# Patient Record
Sex: Male | Born: 1955 | Race: White | Hispanic: No | Marital: Single | Smoking: Never smoker
Health system: Southern US, Community
[De-identification: ages and names within clinical notes are randomized; demographics above are authoritative.]

## PROBLEM LIST (undated history)

## (undated) DIAGNOSIS — I10 Essential (primary) hypertension: Secondary | ICD-10-CM

## (undated) DIAGNOSIS — E785 Hyperlipidemia, unspecified: Secondary | ICD-10-CM

## (undated) DIAGNOSIS — I5189 Other ill-defined heart diseases: Secondary | ICD-10-CM

## (undated) DIAGNOSIS — N529 Male erectile dysfunction, unspecified: Secondary | ICD-10-CM

## (undated) DIAGNOSIS — I2510 Atherosclerotic heart disease of native coronary artery without angina pectoris: Secondary | ICD-10-CM

## (undated) DIAGNOSIS — E039 Hypothyroidism, unspecified: Secondary | ICD-10-CM

## (undated) DIAGNOSIS — I219 Acute myocardial infarction, unspecified: Secondary | ICD-10-CM

## (undated) DIAGNOSIS — K219 Gastro-esophageal reflux disease without esophagitis: Secondary | ICD-10-CM

## (undated) DIAGNOSIS — Z8619 Personal history of other infectious and parasitic diseases: Secondary | ICD-10-CM

## (undated) HISTORY — DX: Gastro-esophageal reflux disease without esophagitis: K21.9

## (undated) HISTORY — DX: Personal history of other infectious and parasitic diseases: Z86.19

## (undated) HISTORY — DX: Hypothyroidism, unspecified: E03.9

## (undated) HISTORY — DX: Acute myocardial infarction, unspecified: I21.9

## (undated) HISTORY — DX: Hyperlipidemia, unspecified: E78.5

## (undated) HISTORY — PX: KNEE SURGERY: SHX244

## (undated) HISTORY — DX: Male erectile dysfunction, unspecified: N52.9

## (undated) HISTORY — DX: Essential (primary) hypertension: I10

---

## 2005-08-14 ENCOUNTER — Emergency Department: Payer: Self-pay | Admitting: General Practice

## 2005-08-14 ENCOUNTER — Other Ambulatory Visit: Payer: Self-pay

## 2005-08-19 ENCOUNTER — Ambulatory Visit: Payer: Self-pay | Admitting: General Practice

## 2008-05-25 IMAGING — CR DG CHEST 2V
1 series · 2 of 2 positions shown · non-contrast
Comparison: none

REASON FOR EXAM: Chest pain  [HOSPITAL]
COMMENTS:

PROCEDURE:     DXR - DXR CHEST PA (OR AP) AND LATERAL  - August 14, 2005 [DATE]
RESULT:        The lung fields are clear.  No pneumonia, pneumothorax or
pleural effusion is seen.  The heart, mediastinal and osseous structures
show no significant abnormalities.

[Series 1: view not recorded · 0.17mm/px · 2 of 2 slices shown]
[im 1/2]
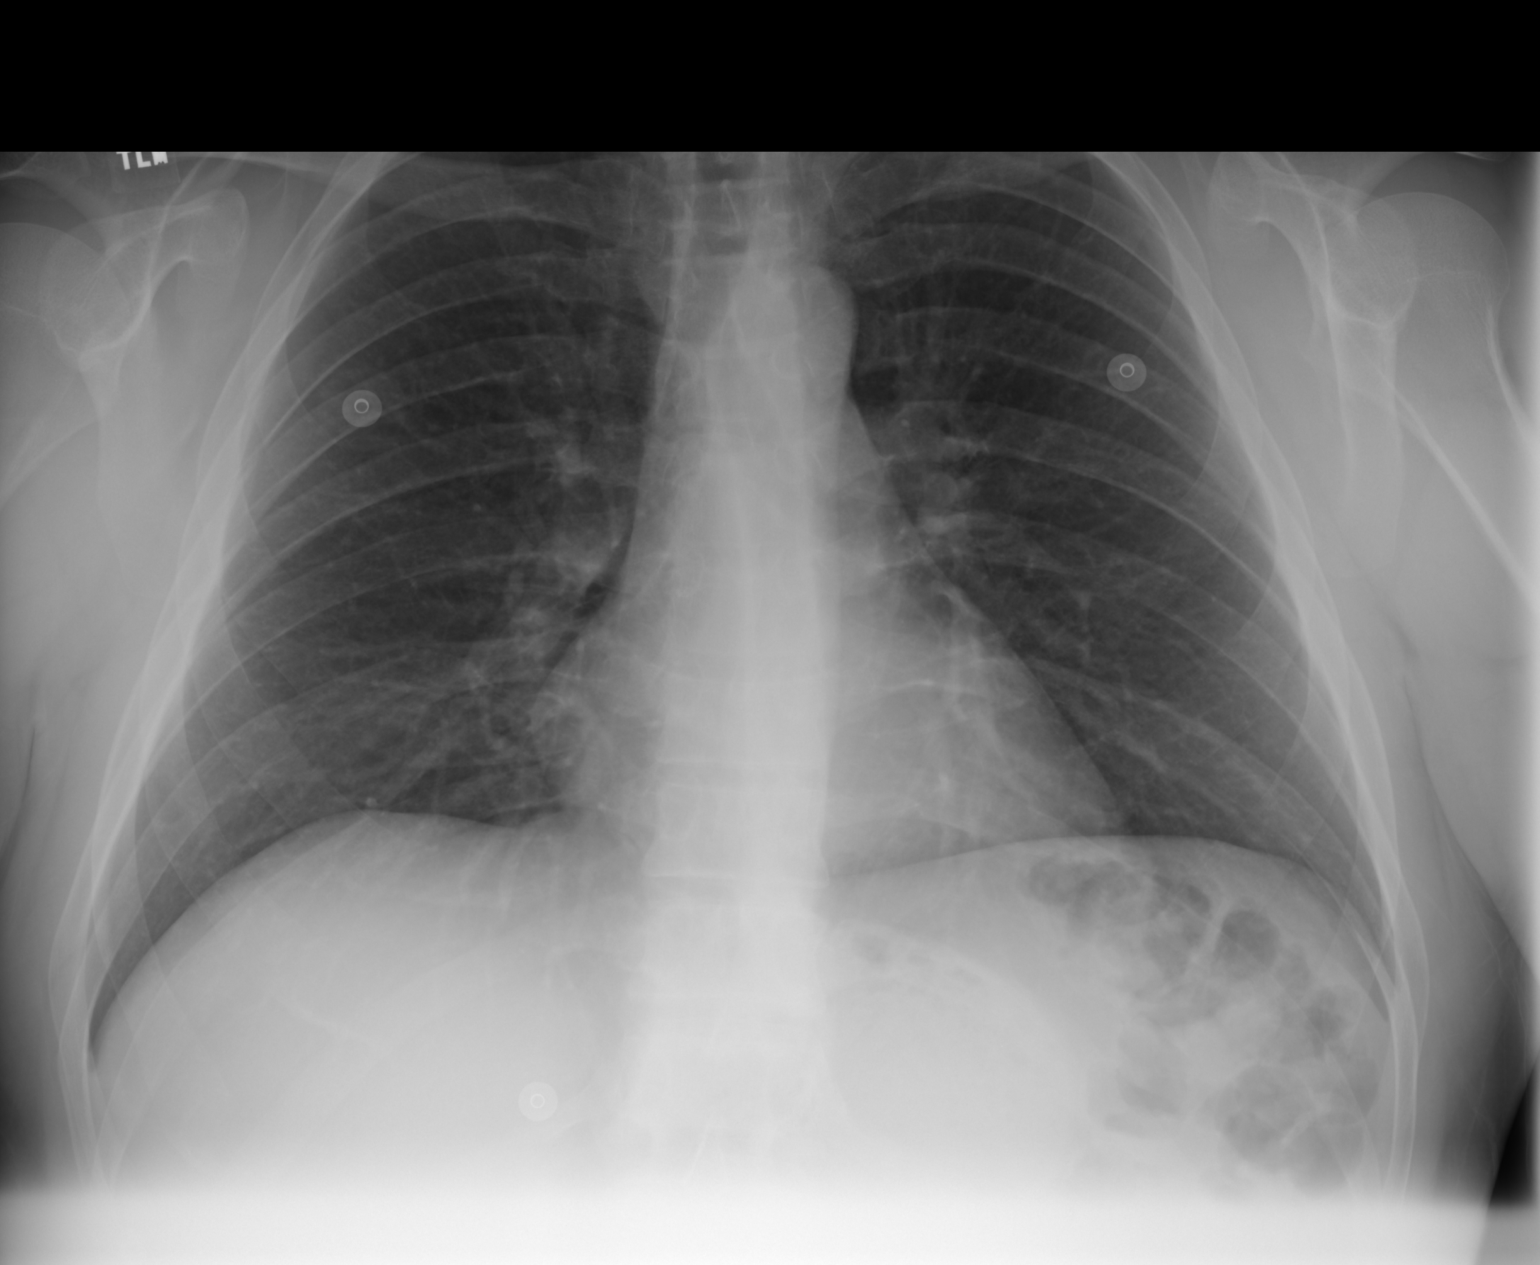
[im 2/2]
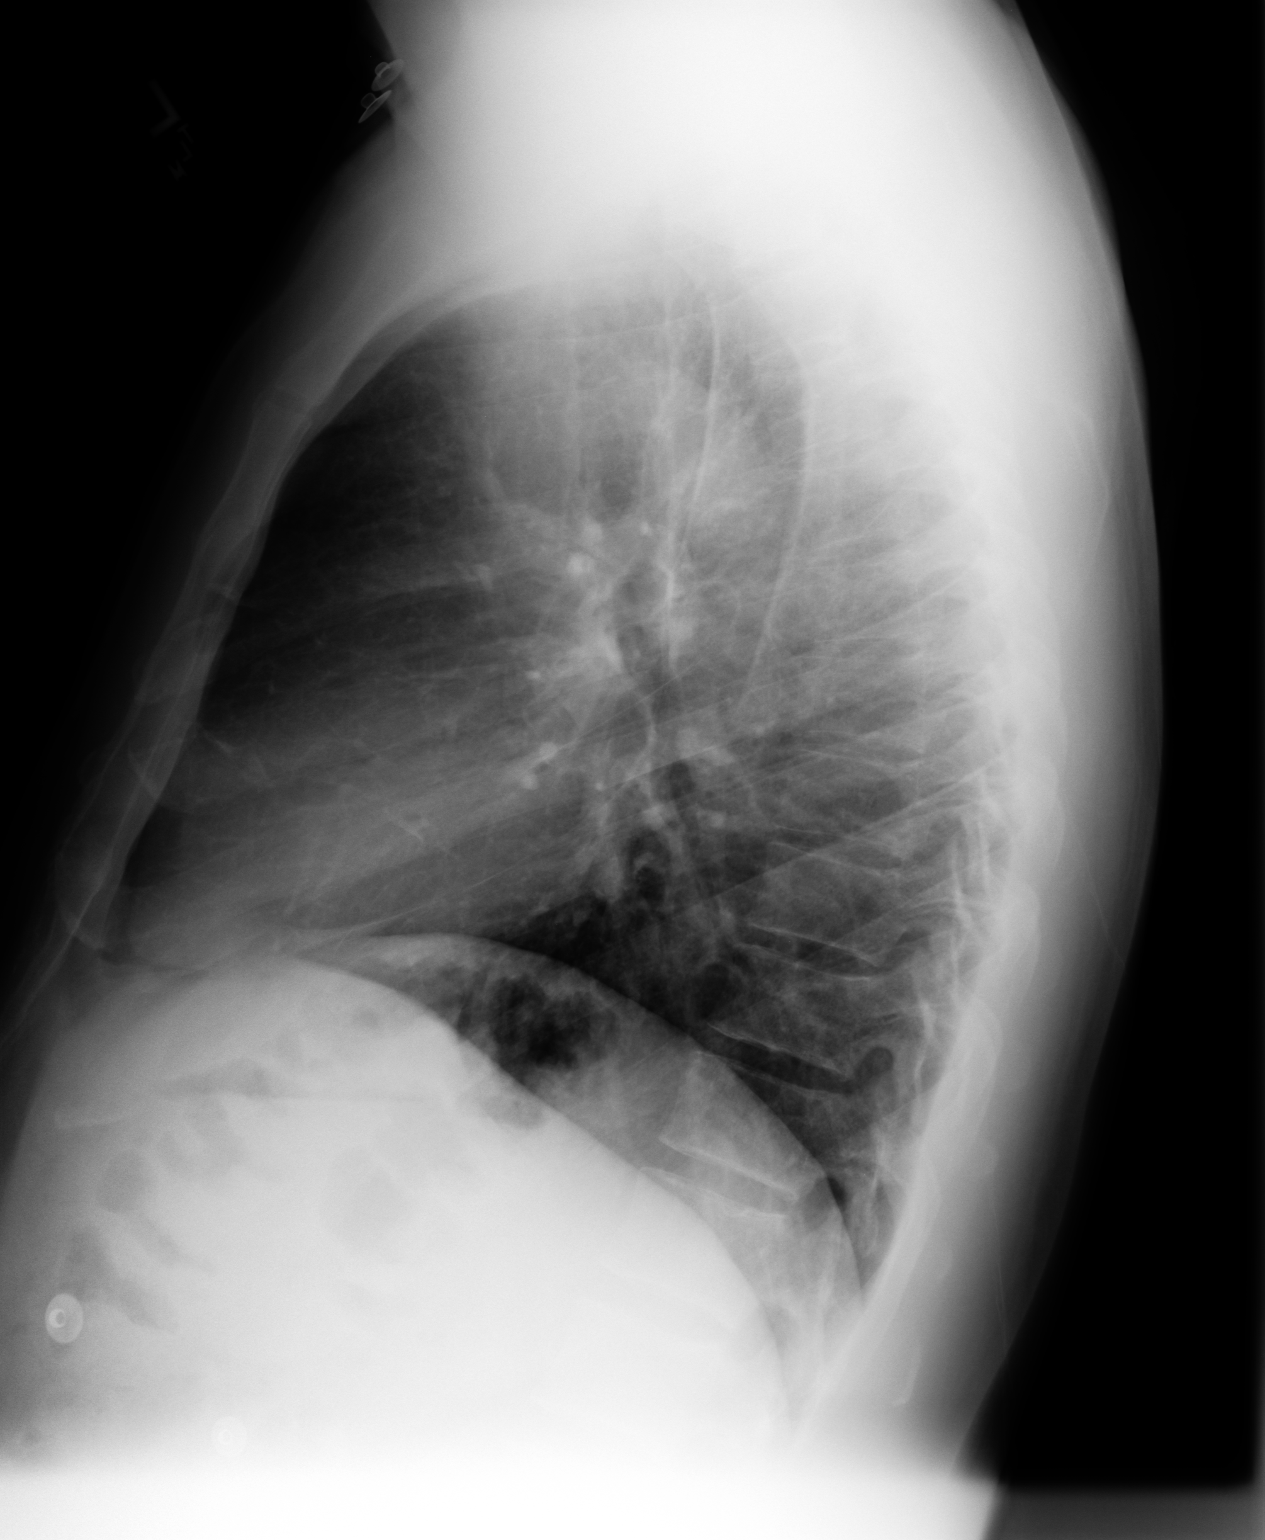

[2 of 2 positions shown; findings below may reference images not displayed]

IMPRESSION: No significant abnormalities are noted.

## 2008-09-29 ENCOUNTER — Emergency Department: Payer: Self-pay | Admitting: Unknown Physician Specialty

## 2010-05-14 ENCOUNTER — Emergency Department: Payer: Self-pay | Admitting: Neurology

## 2011-03-05 HISTORY — PX: DOBUTAMINE STRESS ECHO: SHX5426

## 2012-02-28 ENCOUNTER — Emergency Department: Payer: Self-pay | Admitting: Emergency Medicine

## 2012-02-28 LAB — CBC
HCT: 43.2 % (ref 40.0–52.0)
HGB: 14.3 g/dL (ref 13.0–18.0)
MCH: 29.8 pg (ref 26.0–34.0)
MCHC: 33.1 g/dL (ref 32.0–36.0)
MCV: 90 fL (ref 80–100)
RBC: 4.79 10*6/uL (ref 4.40–5.90)

## 2012-02-28 LAB — COMPREHENSIVE METABOLIC PANEL
Albumin: 3.6 g/dL (ref 3.4–5.0)
Alkaline Phosphatase: 64 U/L (ref 50–136)
Bilirubin,Total: 0.1 mg/dL — ABNORMAL LOW (ref 0.2–1.0)
Calcium, Total: 8.5 mg/dL (ref 8.5–10.1)
Co2: 25 mmol/L (ref 21–32)
Creatinine: 1 mg/dL (ref 0.60–1.30)
EGFR (African American): >60
EGFR (Non-African Amer.): >60
Glucose: 117 mg/dL — ABNORMAL HIGH (ref 65–99)
Osmolality: 295 (ref 275–301)
Potassium: 3.4 mmol/L — ABNORMAL LOW (ref 3.5–5.1)
SGOT(AST): 18 U/L (ref 15–37)
SGPT (ALT): 26 U/L (ref 12–78)
Sodium: 147 mmol/L — ABNORMAL HIGH (ref 136–145)

## 2012-02-28 LAB — CK TOTAL AND CKMB (NOT AT ARMC)
CK, Total: 97 U/L (ref 35–232)
CK-MB: 0.7 ng/mL (ref 0.5–3.6)

## 2012-02-28 LAB — TROPONIN I: Troponin-I: <0.02 ng/mL

## 2012-03-18 ENCOUNTER — Ambulatory Visit (INDEPENDENT_AMBULATORY_CARE_PROVIDER_SITE_OTHER): Payer: 59 | Admitting: Cardiovascular Disease

## 2012-03-18 ENCOUNTER — Encounter: Payer: Self-pay | Admitting: Cardiovascular Disease

## 2012-03-18 VITALS — BP 112/80 | HR 53 | Ht 75.0 in | Wt 225.2 lb

## 2012-03-18 DIAGNOSIS — R1013 Epigastric pain: Secondary | ICD-10-CM

## 2012-03-18 DIAGNOSIS — R6884 Jaw pain: Secondary | ICD-10-CM

## 2012-03-18 DIAGNOSIS — R0789 Other chest pain: Secondary | ICD-10-CM

## 2012-03-18 DIAGNOSIS — K30 Functional dyspepsia: Secondary | ICD-10-CM

## 2012-03-18 DIAGNOSIS — E785 Hyperlipidemia, unspecified: Secondary | ICD-10-CM | POA: Insufficient documentation

## 2012-03-18 NOTE — Progress Notes (Signed)
   Patient ID: Jackson Williamson, male    DOB: [birth year], 57 y.o.   MRN: [id number]  HPI Comments: Jackson Williamson is a 57 year old gentleman with history of hyperlipidemia, currently with no primary care physician is scheduled to see [facility] in one month with reported echocardiogram and stress echo several years ago at [place], reports that he does not like taking medications, strong family history of coronary artery disease with brother having recent diagnosis of severe coronary artery disease, who presents for evaluation after recent episodes of jaw pain, sweating.  Reports that he went to the emergency room on 02/28/2012 after having jaw pain. When he had sweating, he got very concerned. Brother had recent diagnosis of coronary artery disease. He reports that he has similar genetics to his brother. He is not a smoker, no diabetes. He exercises on a regular basis 5 days per week with good exercise tolerance. No further episodes of jaw pain in the past several weeks.  He reports that he is always had an abnormal EKG with "inverted T wave" Lab work in the hospital was essentially normal  EKG in the  hospital December 27 showed normal sinus rhythm with rate 61 beats per minute, no significant ST or T wave changes EKG today shows sinus bradycardia with rate 53 beats per minute with no significant ST or T wave changes   Outpatient Encounter Prescriptions as of 03/18/2012  Medication Sig Dispense Refill  . aspirin 81 MG tablet Take 81 mg by mouth 2 (two) times daily.        Review of Systems  Constitutional: Negative.        Episode of sweating  HENT: Negative.        Jaw pain  Eyes: Negative.   Respiratory: Negative.   Cardiovascular: Negative.   Gastrointestinal: Negative.   Musculoskeletal: Negative.   Skin: Negative.   Neurological: Negative.   Hematological: Negative.   Psychiatric/Behavioral: Negative.   All other systems reviewed and are negative.    BP 112/80  Pulse 53  Ht  6\' 3"  (1.905 m)  Wt 225 lb 4 oz (102.173 kg)  BMI 28.15 kg/m2  Physical Exam  Nursing note and vitals reviewed. Constitutional: He is oriented to person, place, and time. He appears well-developed and well-nourished.  HENT:  Head: Normocephalic.  Nose: Nose normal.  Mouth/Throat: Oropharynx is clear and moist.  Eyes: Conjunctivae normal are normal. Pupils are equal, round, and reactive to light.  Neck: Normal range of motion. Neck supple. No JVD present.  Cardiovascular: Normal rate, regular rhythm, S1 normal, S2 normal, normal heart sounds and intact distal pulses.  Exam reveals no gallop and no friction rub.   No murmur heard. Pulmonary/Chest: Effort normal and breath sounds normal. No respiratory distress. He has no wheezes. He has no rales. He exhibits no tenderness.  Abdominal: Soft. Bowel sounds are normal. He exhibits no distension. There is no tenderness.  Musculoskeletal: Normal range of motion. He exhibits no edema and no tenderness.  Lymphadenopathy:    He has no cervical adenopathy.  Neurological: He is alert and oriented to person, place, and time. Coordination normal.  Skin: Skin is warm and dry. No rash noted. No erythema.  Psychiatric: He has a normal mood and affect. His behavior is normal. Judgment and thought content normal.           Assessment and Plan

## 2012-03-18 NOTE — Patient Instructions (Addendum)
You are doing well. No medication changes were made.  We will check you labs next week  Please call us if you have new issues that need to be addressed before your next appt.  Your physician wants you to follow-up in: 6 months.  You will receive a reminder letter in the mail two months in advance. If you don't receive a letter, please call our office to schedule the follow-up appointment.

## 2012-03-18 NOTE — Assessment & Plan Note (Signed)
We'll check his cholesterol next week. If high, we will encourage him to restart a statin. He was previously on lovastatin and stopped this several years ago on his own.

## 2012-03-18 NOTE — Assessment & Plan Note (Signed)
Atypical type symptoms. No significant chest pain with exertion. We have suggested that he coughed this for any symptoms of chest pain or anginal type symptoms with exertion. We did discuss doing a stress test. He would like to wait at this time. Jaw pain could have been secondary to TMJ. Has not recurred.

## 2012-03-26 ENCOUNTER — Telehealth: Payer: Self-pay

## 2012-03-26 NOTE — Telephone Encounter (Signed)
lmtcb re:due for labs

## 2012-03-31 ENCOUNTER — Other Ambulatory Visit: Payer: 59

## 2012-03-31 NOTE — Telephone Encounter (Signed)
Pt coming in today for labs

## 2012-04-03 ENCOUNTER — Ambulatory Visit (INDEPENDENT_AMBULATORY_CARE_PROVIDER_SITE_OTHER): Payer: 59

## 2012-04-03 DIAGNOSIS — R0789 Other chest pain: Secondary | ICD-10-CM

## 2012-04-03 DIAGNOSIS — E785 Hyperlipidemia, unspecified: Secondary | ICD-10-CM

## 2012-04-04 LAB — LIPID PANEL
Chol/HDL Ratio: 7.4 ratio units — ABNORMAL HIGH (ref 0.0–5.0)
HDL: 40 mg/dL (ref >39)

## 2012-04-06 ENCOUNTER — Other Ambulatory Visit: Payer: Self-pay

## 2012-04-06 DIAGNOSIS — E785 Hyperlipidemia, unspecified: Secondary | ICD-10-CM

## 2012-04-06 MED ORDER — ATORVASTATIN CALCIUM 40 MG PO TABS: 40.0000 mg | ORAL_TABLET | Freq: Every day | ORAL | Status: DC

## 2012-04-29 ENCOUNTER — Ambulatory Visit (INDEPENDENT_AMBULATORY_CARE_PROVIDER_SITE_OTHER): Payer: 59 | Admitting: Family Medicine

## 2012-04-29 ENCOUNTER — Encounter: Payer: Self-pay | Admitting: Family Medicine

## 2012-04-29 VITALS — BP 128/82 | HR 64 | Temp 97.7°F | Ht 75.0 in | Wt 223.8 lb

## 2012-04-29 DIAGNOSIS — E039 Hypothyroidism, unspecified: Secondary | ICD-10-CM | POA: Insufficient documentation

## 2012-04-29 DIAGNOSIS — K4090 Unilateral inguinal hernia, without obstruction or gangrene, not specified as recurrent: Secondary | ICD-10-CM

## 2012-04-29 DIAGNOSIS — K219 Gastro-esophageal reflux disease without esophagitis: Secondary | ICD-10-CM | POA: Insufficient documentation

## 2012-04-29 DIAGNOSIS — E785 Hyperlipidemia, unspecified: Secondary | ICD-10-CM

## 2012-04-29 DIAGNOSIS — Z125 Encounter for screening for malignant neoplasm of prostate: Secondary | ICD-10-CM

## 2012-04-29 DIAGNOSIS — Z113 Encounter for screening for infections with a predominantly sexual mode of transmission: Secondary | ICD-10-CM

## 2012-04-29 LAB — TSH: TSH: 5.38 u[IU]/mL (ref 0.35–5.50)

## 2012-04-29 LAB — T4, FREE: Free T4: 0.84 ng/dL (ref 0.60–1.60)

## 2012-04-29 NOTE — Assessment & Plan Note (Signed)
Screen per patient request.

## 2012-04-29 NOTE — Progress Notes (Signed)
Subjective:    Patient ID: Jackson Williamson, male    DOB: 1955/07/26, 57 y.o.   MRN: [id number]  HPI CC: new pt to establish  Prior PCP Dr. Patrecia Pace.  Got upset .  Last seen 3-4 yrs ago  HLD - LDL 230.  + fmhx CAD.  Tolerating lipitor well.  No myalgias.  Occasional foot cramps. Hypothyroid - has not had thyroid medication in several years.  Due for recheck. GERD - tries to follow paleo diet.  Occasional OTC meds, don't help (including prilosec).  Weight lifting last week - left inguinal hernia much larger after weight.  Also with umbilical hernia. Neither bothering him.  Requests STD screen.  >2 hours since last void.  No rashes, dysuria, discharge.  1 partner in last year.  New partner - widow, wants to be very careful with her.  Preventative: No recent CPE  Lives with daughter, 1 dog (Advertising account planner) Occupation: Museum/gallery curator Edu: BS Activity: runs, bikes, swims, cross fit 5-6 d/wk Diet: good water, fruits/vegetables daily, "80% paleo"  Medications and allergies reviewed and updated in chart.  Past histories reviewed and updated if relevant as below. Patient Active Problem List  Diagnosis  . Jaw pain  . Hyperlipidemia   Past Medical History  Diagnosis Date  . Hyperlipidemia   . Hypothyroidism   . GERD (gastroesophageal reflux disease)    Past Surgical History  Procedure Laterality Date  . Knee surgery  [phone]    left knee   History  Substance Use Topics  . Smoking status: Never Smoker   . Smokeless tobacco: Former Neurosurgeon    Types: Snuff    Quit date: 03/18/2008     Comment: social as youth  . Alcohol Use: No   Family History  Problem Relation Age of Onset  . CAD Mother 3    s/p stent  . Hyperlipidemia Mother   . CAD Father 93    s/p CABG x 4  . Hyperlipidemia Father   . Hyperlipidemia Brother   . Hypertension Father   . CAD Brother 29    stent   No Known Allergies Current Outpatient Prescriptions on File Prior to Visit  Medication Sig  Dispense Refill  . aspirin 81 MG tablet Take 81 mg by mouth daily.        No current facility-administered medications on file prior to visit.     Review of Systems  Constitutional: Negative for fever, chills, activity change, appetite change, fatigue and unexpected weight change.  HENT: Negative for hearing loss and neck pain.   Eyes: Negative for visual disturbance.  Respiratory: Negative for cough, chest tightness, shortness of breath and wheezing.   Cardiovascular: Negative for chest pain, palpitations and leg swelling.  Gastrointestinal: Negative for nausea, vomiting, abdominal pain, diarrhea, constipation, blood in stool and abdominal distention.  Genitourinary: Negative for hematuria and difficulty urinating.  Musculoskeletal: Negative for myalgias and arthralgias.  Skin: Negative for rash.  Neurological: Negative for dizziness, seizures, syncope and headaches.  Hematological: Does not bruise/bleed easily.  Psychiatric/Behavioral: Negative for dysphoric mood. The patient is not nervous/anxious.        Objective:   Physical Exam  Nursing note and vitals reviewed. Constitutional: He is oriented to person, place, and time. He appears well-developed and well-nourished. No distress.  HENT:  Head: Normocephalic and atraumatic.  Right Ear: Hearing, tympanic membrane, external ear and ear canal normal.  Left Ear: Hearing, tympanic membrane, external ear and ear canal normal.  Nose: Nose normal.  Mouth/Throat: Oropharynx  is clear and moist. No oropharyngeal exudate.  Eyes: Conjunctivae and EOM are normal. Pupils are equal, round, and reactive to light. No scleral icterus.  Neck: Normal range of motion. Neck supple. No thyromegaly present.  Cardiovascular: Normal rate, regular rhythm, normal heart sounds and intact distal pulses.   No murmur heard. Pulses:      Radial pulses are 2+ on the right side, and 2+ on the left side.  Pulmonary/Chest: Effort normal and breath sounds normal.  No respiratory distress. He has no wheezes. He has no rales.  Abdominal: Soft. Normal appearance and bowel sounds are normal. He exhibits no distension and no mass. There is no tenderness. There is no rebound and no guarding. A hernia is present. Hernia confirmed positive in the right inguinal area.  Umbilical hernia, nontender, about 2cm size Large R inguinal hernia protruding into scrotum, reducible when laying down, not tender  Musculoskeletal: Normal range of motion. He exhibits no edema.  Lymphadenopathy:    He has no cervical adenopathy.  Neurological: He is alert and oriented to person, place, and time.  CN grossly intact, station and gait intact  Skin: Skin is warm and dry. No rash noted.  Psychiatric: He has a normal mood and affect. His behavior is normal. Judgment and thought content normal.       Assessment & Plan:

## 2012-04-29 NOTE — Assessment & Plan Note (Signed)
Large R hernia, not strangulated. Refer to surgery. Avoid heavy lifting until evaluation. Discussed red flags to seek emergent care.

## 2012-04-29 NOTE — Patient Instructions (Addendum)
Blood work today to check thyroid and STD screen. Return in 3 months for physical, prior fasting for blood work to recheck cholesterol levels. Good to meet you today, call us with questions.

## 2012-04-29 NOTE — Assessment & Plan Note (Addendum)
Check TFTs today. Start synthroid accordingly.  States prior on dose.

## 2012-04-29 NOTE — Assessment & Plan Note (Signed)
Diet controlled.  

## 2012-04-29 NOTE — Assessment & Plan Note (Signed)
Very elevated, fmhx CAD and HLD. Started on lipitor 40mg  daily per cards. I have asked him to return in 3 mo to recheck cholesterol levels.

## 2012-04-30 LAB — GC/CHLAMYDIA PROBE AMP, URINE: Chlamydia, Swab/Urine, PCR: NEGATIVE

## 2012-04-30 LAB — RPR

## 2012-05-02 HISTORY — PX: HERNIA REPAIR: SHX51

## 2012-05-20 ENCOUNTER — Other Ambulatory Visit: Payer: Self-pay | Admitting: General Surgery

## 2012-05-20 DIAGNOSIS — K4090 Unilateral inguinal hernia, without obstruction or gangrene, not specified as recurrent: Secondary | ICD-10-CM

## 2012-05-20 DIAGNOSIS — K429 Umbilical hernia without obstruction or gangrene: Secondary | ICD-10-CM

## 2012-05-21 ENCOUNTER — Ambulatory Visit: Payer: Self-pay | Admitting: General Surgery

## 2012-05-29 ENCOUNTER — Ambulatory Visit: Payer: Self-pay | Admitting: General Surgery

## 2012-05-29 DIAGNOSIS — K4090 Unilateral inguinal hernia, without obstruction or gangrene, not specified as recurrent: Secondary | ICD-10-CM

## 2012-05-29 DIAGNOSIS — K429 Umbilical hernia without obstruction or gangrene: Secondary | ICD-10-CM

## 2012-06-02 ENCOUNTER — Encounter: Payer: Self-pay | Admitting: General Surgery

## 2012-06-11 ENCOUNTER — Ambulatory Visit (INDEPENDENT_AMBULATORY_CARE_PROVIDER_SITE_OTHER): Payer: 59 | Admitting: General Surgery

## 2012-06-11 ENCOUNTER — Encounter: Payer: Self-pay | Admitting: General Surgery

## 2012-06-11 VITALS — BP 132/82 | HR 64 | Resp 14 | Ht 75.0 in | Wt 221.0 lb

## 2012-06-11 DIAGNOSIS — K4090 Unilateral inguinal hernia, without obstruction or gangrene, not specified as recurrent: Secondary | ICD-10-CM

## 2012-06-11 DIAGNOSIS — K429 Umbilical hernia without obstruction or gangrene: Secondary | ICD-10-CM

## 2012-06-11 NOTE — Progress Notes (Signed)
Patient ID: Jackson Williamson, male   DOB: 1955-04-03, 57 y.o.   MRN: [id number]  Chief Complaint  Patient presents with  . Routine Post Op    umbilical hernia    HPI Jackson Williamson is a 57 y.o. male.  Patient here today for follow up from laparoscopic right inguinal hernia and umbilical hernia repair with physio mesh on 05-29-12.  No pain medications, and getting along well. HPI  Past Medical History  Diagnosis Date  . Hyperlipidemia   . Hypothyroidism   . GERD (gastroesophageal reflux disease)   . History of chicken pox     Past Surgical History  Procedure Laterality Date  . Knee surgery  [phone]    left knee  . Hernia repair  March 2014    RIH and umbilical    Family History  Problem Relation Age of Onset  . CAD Mother 30    s/p stent  . Hyperlipidemia Mother   . CAD Father 63    s/p CABG x 4  . Hyperlipidemia Father   . Hyperlipidemia Brother   . Hypertension Father   . CAD Brother 61    stent    Social History History  Substance Use Topics  . Smoking status: Never Smoker   . Smokeless tobacco: Former Neurosurgeon    Types: Snuff    Quit date: 03/18/2008     Comment: social as youth  . Alcohol Use: No    No Known Allergies  Current Outpatient Prescriptions  Medication Sig Dispense Refill  . aspirin 81 MG tablet Take 81 mg by mouth daily.       Marland Kitchen atorvastatin (LIPITOR) 40 MG tablet Take 40 mg by mouth at bedtime.       No current facility-administered medications for this visit.    Review of Systems Review of Systems  Constitutional: Negative.   Respiratory: Negative.   Cardiovascular: Negative.     Blood pressure 132/82, pulse 64, resp. rate 14, height 6\' 3"  (1.905 m), weight 221 lb (100.245 kg).  Physical Exam Physical Exam  Constitutional: He is oriented to person, place, and time. He appears well-developed and well-nourished.  Abdominal: Soft.  Neurological: He is alert and oriented to person, place, and time.  Skin: Skin is warm and dry.  Port  sites clean and healing well Mild swelling at umbilical and right groin area but no hernia noted  Data Reviewed    Assessment    Doing well post op     Plan    Increase activity as tolerated slowly back to normal.        Anarosa Kubisiak G 06/11/2012, 11:15 AM

## 2012-06-11 NOTE — Patient Instructions (Addendum)
Resume activities gradually as tolerated, no running as of yet. Bicycle is ok.

## 2012-07-06 ENCOUNTER — Telehealth: Payer: Self-pay

## 2012-07-06 NOTE — Telephone Encounter (Signed)
lmtcb re:due for l/l

## 2012-07-06 NOTE — Telephone Encounter (Signed)
LL

## 2012-07-09 ENCOUNTER — Ambulatory Visit: Payer: 59 | Admitting: General Surgery

## 2012-07-09 NOTE — Telephone Encounter (Signed)
I spoke with the patient. He is having a cmp/tsh/psa/lipomed profile done on 5/22 with Dr. Sharen Hones at [facility]. The patient states he asked Dr. Sharen Hones to order the lipomed profile, but Dr. Sharen Hones stated he may not be able to interpret this. I advised I will leave a message for Efraim Kaufmann so she can f/u on his labs after 5/22. He also states that he just had 2 hernias repaired and his surgeon stated he heard a murmur. Per Dr. Windell Hummingbird 1/14 office note, no murmur heard. The patient is due for his physical with his PCP on 5/29.

## 2012-07-10 ENCOUNTER — Encounter: Payer: Self-pay | Admitting: *Deleted

## 2012-07-10 NOTE — Telephone Encounter (Signed)
Fyi; please see below [id number]. Do you need to see him sooner or have him f/u with PCP as planned?

## 2012-07-10 NOTE — Telephone Encounter (Signed)
Would see if care physician can appreciate murmur If murmur heard, echocardiogram could be ordered

## 2012-07-13 NOTE — Telephone Encounter (Signed)
Please see below [id number] Let us know if he needs echo Thanks!!

## 2012-07-23 ENCOUNTER — Ambulatory Visit: Payer: 59 | Admitting: General Surgery

## 2012-07-23 ENCOUNTER — Other Ambulatory Visit (INDEPENDENT_AMBULATORY_CARE_PROVIDER_SITE_OTHER): Payer: 59

## 2012-07-23 DIAGNOSIS — E785 Hyperlipidemia, unspecified: Secondary | ICD-10-CM

## 2012-07-23 DIAGNOSIS — E039 Hypothyroidism, unspecified: Secondary | ICD-10-CM

## 2012-07-23 DIAGNOSIS — Z125 Encounter for screening for malignant neoplasm of prostate: Secondary | ICD-10-CM

## 2012-07-23 LAB — COMPREHENSIVE METABOLIC PANEL
Albumin: 3.8 g/dL (ref 3.5–5.2)
BUN: 19 mg/dL (ref 6–23)
Calcium: 9.2 mg/dL (ref 8.4–10.5)
Chloride: 107 mEq/L (ref 96–112)
Creatinine, Ser: 1.2 mg/dL (ref 0.4–1.5)
GFR: 65.66 mL/min (ref >60.00)
Glucose, Bld: 94 mg/dL (ref 70–99)
Potassium: 4.1 mEq/L (ref 3.5–5.1)

## 2012-07-23 LAB — TSH: TSH: 7.76 u[IU]/mL — ABNORMAL HIGH (ref 0.35–5.50)

## 2012-07-23 LAB — PSA: PSA: 1.15 ng/mL (ref 0.10–4.00)

## 2012-07-28 LAB — NMR LIPOPROFILE WITH LIPIDS
HDL Size: 8.6 nm — ABNORMAL LOW (ref >=9.2)
LDL Particle Number: 1214 nmol/L — ABNORMAL HIGH (ref <1000)
LDL Size: 20.4 nm — ABNORMAL LOW (ref >20.5)
Large HDL-P: 3.8 umol/L — ABNORMAL LOW (ref >=4.8)
Large VLDL-P: 2.5 nmol/L (ref <=2.7)
Small LDL Particle Number: 684 nmol/L — ABNORMAL HIGH (ref <=527)
VLDL Size: 53.9 nm — ABNORMAL HIGH (ref <=46.6)

## 2012-07-29 ENCOUNTER — Encounter: Payer: Self-pay | Admitting: Family Medicine

## 2012-07-30 ENCOUNTER — Encounter: Payer: Self-pay | Admitting: Family Medicine

## 2012-07-30 ENCOUNTER — Ambulatory Visit (INDEPENDENT_AMBULATORY_CARE_PROVIDER_SITE_OTHER): Payer: 59 | Admitting: Family Medicine

## 2012-07-30 VITALS — BP 128/84 | HR 68 | Temp 98.1°F | Ht 75.0 in | Wt 215.5 lb

## 2012-07-30 DIAGNOSIS — E039 Hypothyroidism, unspecified: Secondary | ICD-10-CM

## 2012-07-30 DIAGNOSIS — Z1211 Encounter for screening for malignant neoplasm of colon: Secondary | ICD-10-CM

## 2012-07-30 DIAGNOSIS — R011 Cardiac murmur, unspecified: Secondary | ICD-10-CM | POA: Insufficient documentation

## 2012-07-30 DIAGNOSIS — K4090 Unilateral inguinal hernia, without obstruction or gangrene, not specified as recurrent: Secondary | ICD-10-CM

## 2012-07-30 DIAGNOSIS — Z0000 Encounter for general adult medical examination without abnormal findings: Secondary | ICD-10-CM

## 2012-07-30 DIAGNOSIS — E785 Hyperlipidemia, unspecified: Secondary | ICD-10-CM

## 2012-07-30 LAB — T4, FREE: Free T4: 0.88 ng/dL (ref 0.60–1.60)

## 2012-07-30 LAB — HEMOCCULT GUIAC POC 1CARD (OFFICE): Fecal Occult Blood, POC: NEGATIVE

## 2012-07-30 NOTE — Assessment & Plan Note (Signed)
Reviewed #s with patient, overall good control, but LDL particle size >1000 - recommended check with Dr. Mariah Milling to see if change in regimen indicated in fmhx but no personal hx CAD.

## 2012-07-30 NOTE — Progress Notes (Signed)
Subjective:    Patient ID: Jackson Williamson, male    DOB: Jun 01, 1955, 57 y.o.   MRN: [id number]  HPI CC: CPE  Does have strong fmhx CAD.  On lipitor 40mg  daily.  Recently had inguinal hernia surgery - has missed 2 appointments with surgeon.  H/o hypothryoidism - strong fmhx.  Prior on levothyroxine .  Denies skin or hair changes, diarrhea,/constipation, heat/cold intolerance. Wt Readings from Last 3 Encounters:  07/30/12 215 lb 8 oz (97.75 kg)  06/11/12 221 lb (100.245 kg)  04/29/12 223 lb 12 oz (101.492 kg)    Preventative:  Colon cancer screening - aunt with colon cancer at age 28.  Discussed - requests stool kit. Prostate cancer screening - has had in past.  Gets checked yearly. Tetanus - unsure - think around 2006 Flu shot - does not receive - got sick after one flu shot  Lives with daughter, 1 dog (Advertising account planner) Occupation: Museum/gallery curator  Edu: BS  Activity: runs, bikes, swims, cross fit 5-6 d/wk  Diet: good water, fruits/vegetables daily, "80% paleo"  Medications and allergies reviewed and updated in chart.  Past histories reviewed and updated if relevant as below. Patient Active Problem List   Diagnosis Date Noted  . Screen for STD (sexually transmitted disease) 04/29/2012  . Right inguinal hernia 04/29/2012  . Hypothyroidism   . GERD (gastroesophageal reflux disease)   . Hyperlipidemia 03/18/2012   Past Medical History  Diagnosis Date  . Hyperlipidemia   . Hypothyroidism   . GERD (gastroesophageal reflux disease)   . History of chicken pox    Past Surgical History  Procedure Laterality Date  . Knee surgery  [phone]    left knee  . Hernia repair Right 05/2012    RIH and umbilical   History  Substance Use Topics  . Smoking status: Never Smoker   . Smokeless tobacco: Former Neurosurgeon    Types: Snuff    Quit date: 03/18/2008     Comment: social as youth  . Alcohol Use: No   Family History  Problem Relation Age of Onset  . CAD Mother 40   s/p stent  . Hyperlipidemia Mother   . CAD Father 82    s/p CABG x 4  . Hyperlipidemia Father   . Hyperlipidemia Brother   . Hypertension Father   . CAD Brother 52    stent  . Cancer Maternal Aunt 62    colon  . Cancer Maternal Uncle 54    prostate  . Cancer Cousin 40    prostate  . Cancer Maternal Uncle 80    kidney   No Known Allergies Current Outpatient Prescriptions on File Prior to Visit  Medication Sig Dispense Refill  . aspirin 81 MG tablet Take 81 mg by mouth daily.       Marland Kitchen atorvastatin (LIPITOR) 40 MG tablet Take 40 mg by mouth at bedtime.       No current facility-administered medications on file prior to visit.     Review of Systems  Constitutional: Negative for fever, chills, activity change, appetite change, fatigue and unexpected weight change.  HENT: Negative for hearing loss and neck pain.   Eyes: Negative for visual disturbance.  Respiratory: Negative for cough, chest tightness, shortness of breath and wheezing.   Cardiovascular: Negative for chest pain, palpitations and leg swelling.  Gastrointestinal: Negative for nausea, vomiting, abdominal pain, diarrhea, constipation, blood in stool and abdominal distention.  Genitourinary: Negative for hematuria and difficulty urinating.  Musculoskeletal: Negative for myalgias and arthralgias.  Skin: Negative for rash.  Neurological: Negative for dizziness, seizures, syncope and headaches.  Hematological: Negative for adenopathy. Does not bruise/bleed easily.  Psychiatric/Behavioral: Negative for dysphoric mood. The patient is not nervous/anxious.        Objective:   Physical Exam  Nursing note and vitals reviewed. Constitutional: He is oriented to person, place, and time. He appears well-developed and well-nourished. No distress.  HENT:  Head: Normocephalic and atraumatic.  Right Ear: External ear normal.  Left Ear: External ear normal.  Nose: Nose normal.  Mouth/Throat: Oropharynx is clear and moist. No  oropharyngeal exudate.  Eyes: Conjunctivae and EOM are normal. Pupils are equal, round, and reactive to light. No scleral icterus.  Neck: Normal range of motion. Neck supple. No thyromegaly present.  Cardiovascular: Normal rate, regular rhythm and intact distal pulses.   Murmur (1/6 SEM best heard at left luwer sternal border) heard. Pulses:      Radial pulses are 2+ on the right side, and 2+ on the left side.  Pulmonary/Chest: Effort normal and breath sounds normal. No respiratory distress. He has no wheezes. He has no rales.  Abdominal: Soft. Bowel sounds are normal. He exhibits no distension and no mass. There is no tenderness. There is no rebound and no guarding. Hernia confirmed negative in the right inguinal area and confirmed negative in the left inguinal area.  Genitourinary: Rectum normal, prostate normal and penis normal. Rectal exam shows no external hemorrhoid, no internal hemorrhoid, no fissure, no mass, no tenderness and anal tone normal. Guaiac negative stool. Prostate is not enlarged (~20gm) and not tender. Right testis shows mass, swelling and tenderness. Right testis is descended. Left testis shows no mass, no swelling and no tenderness. Left testis is descended.  Large R hydrocele s/p hernia repair  Musculoskeletal: Normal range of motion. He exhibits no edema.  Lymphadenopathy:    He has no cervical adenopathy.  Neurological: He is alert and oriented to person, place, and time.  CN grossly intact, station and gait intact  Skin: Skin is warm and dry. No rash noted.  Psychiatric: He has a normal mood and affect. His behavior is normal. Judgment and thought content normal.      Assessment & Plan:

## 2012-07-30 NOTE — Assessment & Plan Note (Signed)
Preventative protocols reviewed and updated unless pt declined. Discussed healthy diet and lifestyle.  

## 2012-07-30 NOTE — Assessment & Plan Note (Signed)
Check TFTs today

## 2012-07-30 NOTE — Assessment & Plan Note (Signed)
S/p RIH repair by Dr. Evette Cristal.  Recommended reschedule f/u appt given anticipated large residual hydrocele.

## 2012-07-30 NOTE — Assessment & Plan Note (Signed)
Very faint - heard only when recumbent.  Will just monitor for now.

## 2012-07-30 NOTE — Patient Instructions (Signed)
Blood work today to recheck thyroid.  If low, we will start thyroid medicine. Check with Dr. Mariah Milling about cholesterol levels. Stool kit today. Good to see you today, call us with questions.

## 2012-08-03 ENCOUNTER — Encounter: Payer: Self-pay | Admitting: *Deleted

## 2012-08-05 ENCOUNTER — Encounter: Payer: Self-pay | Admitting: Family Medicine

## 2012-08-06 ENCOUNTER — Other Ambulatory Visit: Payer: 59

## 2012-08-06 DIAGNOSIS — Z1211 Encounter for screening for malignant neoplasm of colon: Secondary | ICD-10-CM

## 2012-08-10 ENCOUNTER — Encounter: Payer: Self-pay | Admitting: *Deleted

## 2012-08-20 ENCOUNTER — Encounter: Payer: Self-pay | Admitting: General Surgery

## 2012-08-20 ENCOUNTER — Ambulatory Visit (INDEPENDENT_AMBULATORY_CARE_PROVIDER_SITE_OTHER): Payer: 59 | Admitting: General Surgery

## 2012-08-20 VITALS — BP 122/72 | HR 74 | Resp 14 | Ht 75.0 in | Wt 217.0 lb

## 2012-08-20 DIAGNOSIS — K4090 Unilateral inguinal hernia, without obstruction or gangrene, not specified as recurrent: Secondary | ICD-10-CM

## 2012-08-20 DIAGNOSIS — N433 Hydrocele, unspecified: Secondary | ICD-10-CM

## 2012-08-20 DIAGNOSIS — K429 Umbilical hernia without obstruction or gangrene: Secondary | ICD-10-CM | POA: Insufficient documentation

## 2012-08-20 NOTE — Patient Instructions (Addendum)
Patient to see Dr. Edwyna Shell at [facility] on 08-28-12 at 9:30 am. He is to arrive 10 minutes prior to appointment time. This patient is aware of date, time, and instructions.

## 2012-08-20 NOTE — Progress Notes (Signed)
Patient ID: Jackson Williamson, male   DOB: [birth year], 57 y.o.   MRN: [id number]  Chief Complaint  Patient presents with  . Other    hernia    HPI Jackson Williamson is a 57 y.o. male.  Patient here today for post op visit RIH and umbilical hernia with pysiomesh done 05/29/12.Patient states he is doing well. Notes swelling in right scrotum, not bothering him HPI  Past Medical History  Diagnosis Date  . Hyperlipidemia   . Hypothyroidism   . GERD (gastroesophageal reflux disease)   . History of chicken pox     Past Surgical History  Procedure Laterality Date  . Knee surgery  [phone]    left knee  . Hernia repair Right 05/2012    RIH and umbilical    Family History  Problem Relation Age of Onset  . CAD Mother 27    s/p stent  . Hyperlipidemia Mother   . CAD Father 82    s/p CABG x 4  . Hyperlipidemia Father   . Hyperlipidemia Brother   . Hypertension Father   . CAD Brother 52    stent  . Cancer Maternal Aunt 62    colon  . Cancer Maternal Uncle 54    prostate  . Cancer Cousin 40    prostate  . Cancer Maternal Uncle 68    kidney    Social History History  Substance Use Topics  . Smoking status: Never Smoker   . Smokeless tobacco: Former Neurosurgeon    Types: Snuff    Quit date: 03/18/2008     Comment: social as youth  . Alcohol Use: No    No Known Allergies  Current Outpatient Prescriptions  Medication Sig Dispense Refill  . aspirin 81 MG tablet Take 81 mg by mouth daily.       Marland Kitchen atorvastatin (LIPITOR) 40 MG tablet Take 40 mg by mouth at bedtime.       No current facility-administered medications for this visit.    Review of Systems Review of Systems  Constitutional: Negative.   Respiratory: Negative.   Cardiovascular: Negative.     Blood pressure 122/72, pulse 74, resp. rate 14, height 6\' 3"  (1.905 m), weight 217 lb (98.431 kg).  Physical Exam Physical Exam  Constitutional: He appears well-developed and well-nourished.  Abdominal: Soft. There is no  hepatomegaly. There is no tenderness. No hernia.    Neurological: He is alert.  Skin: Skin is dry.    Data Reviewed  [place] over right crotal area shows large fluid collection  Assessment   Hydrocele  Plan    Would like a GU consult     Patient to see Dr. Edwyna Shell at [facility] on 08-28-12 at 9:30 am. He is to arrive 10 minutes prior to appointment time. This patient is aware of date, time, and instructions.    Jackson Williamson 08/20/2012, 12:08 PM

## 2012-11-17 ENCOUNTER — Telehealth: Payer: Self-pay | Admitting: *Deleted

## 2012-11-17 NOTE — Telephone Encounter (Signed)
Lmom to call our office, it's time to schedule an appt with Dr. Kirke Corin

## 2013-07-26 ENCOUNTER — Other Ambulatory Visit: Payer: Self-pay | Admitting: Family Medicine

## 2013-07-26 DIAGNOSIS — Z125 Encounter for screening for malignant neoplasm of prostate: Secondary | ICD-10-CM

## 2013-07-26 DIAGNOSIS — E785 Hyperlipidemia, unspecified: Secondary | ICD-10-CM

## 2013-07-26 DIAGNOSIS — E039 Hypothyroidism, unspecified: Secondary | ICD-10-CM

## 2013-07-27 ENCOUNTER — Other Ambulatory Visit (INDEPENDENT_AMBULATORY_CARE_PROVIDER_SITE_OTHER): Payer: 59

## 2013-07-27 DIAGNOSIS — E785 Hyperlipidemia, unspecified: Secondary | ICD-10-CM

## 2013-07-27 DIAGNOSIS — E039 Hypothyroidism, unspecified: Secondary | ICD-10-CM

## 2013-07-27 DIAGNOSIS — Z125 Encounter for screening for malignant neoplasm of prostate: Secondary | ICD-10-CM

## 2013-07-27 LAB — BASIC METABOLIC PANEL
BUN: 21 mg/dL (ref 6–23)
CALCIUM: 8.9 mg/dL (ref 8.4–10.5)
CO2: 22 meq/L (ref 19–32)
CREATININE: 1.1 mg/dL (ref 0.4–1.5)
Chloride: 107 mEq/L (ref 96–112)
GFR: 72.95 mL/min (ref >60.00)
Glucose, Bld: 91 mg/dL (ref 70–99)
Potassium: 4.2 mEq/L (ref 3.5–5.1)
SODIUM: 136 meq/L (ref 135–145)

## 2013-07-27 LAB — LIPID PANEL
CHOL/HDL RATIO: 5
CHOLESTEROL: 236 mg/dL — AB (ref 0–200)
HDL: 43.2 mg/dL (ref >39.00)
LDL Cholesterol: 176 mg/dL — ABNORMAL HIGH (ref 0–99)
TRIGLYCERIDES: 86 mg/dL (ref 0.0–149.0)
VLDL: 17.2 mg/dL (ref 0.0–40.0)

## 2013-07-27 LAB — TSH: TSH: 4.9 u[IU]/mL — AB (ref 0.35–4.50)

## 2013-07-27 LAB — T4, FREE: FREE T4: 0.7 ng/dL (ref 0.60–1.60)

## 2013-07-27 LAB — PSA: PSA: 0.46 ng/mL (ref 0.10–4.00)

## 2013-08-03 ENCOUNTER — Encounter: Payer: Self-pay | Admitting: Family Medicine

## 2013-08-03 ENCOUNTER — Ambulatory Visit (INDEPENDENT_AMBULATORY_CARE_PROVIDER_SITE_OTHER): Payer: 59 | Admitting: Family Medicine

## 2013-08-03 VITALS — BP 130/80 | HR 60 | Temp 98.2°F | Ht 75.0 in | Wt 218.0 lb

## 2013-08-03 DIAGNOSIS — Z0000 Encounter for general adult medical examination without abnormal findings: Secondary | ICD-10-CM

## 2013-08-03 DIAGNOSIS — Z1211 Encounter for screening for malignant neoplasm of colon: Secondary | ICD-10-CM

## 2013-08-03 DIAGNOSIS — E039 Hypothyroidism, unspecified: Secondary | ICD-10-CM

## 2013-08-03 DIAGNOSIS — E785 Hyperlipidemia, unspecified: Secondary | ICD-10-CM

## 2013-08-03 MED ORDER — LEVOTHYROXINE SODIUM 75 MCG PO TABS: 75.0000 ug | ORAL_TABLET | Freq: Every day | ORAL | Status: DC

## 2013-08-03 MED ORDER — ATORVASTATIN CALCIUM 40 MG PO TABS
40.0000 mg | ORAL_TABLET | Freq: Every day | ORAL | Status: DC
Start: 2013-08-03 — End: 2014-08-05

## 2013-08-03 NOTE — Progress Notes (Signed)
Pre visit review using our clinic review tool, if applicable. No additional management support is needed unless otherwise documented below in the visit note. 

## 2013-08-03 NOTE — Progress Notes (Signed)
BP 130/80  Pulse 60  Temp(Src) 98.2 F (36.8 C) (Oral)  Ht 6' 3"  (1.905 m)  Wt 218 lb (98.884 kg)  BMI 27.25 kg/m2   CC: CPE  Subjective:    Patient ID: Jackson Williamson, male    DOB: [birth year], 58 y.o.   MRN: [id number]  HPI: Jackson Williamson is a 58 y.o. male presenting on 08/03/2013 for Annual Exam   Does have strong fmhx CAD. Previously on lipitor 76m daily. Stopped taking 6-7 mo ago. Forgot to fill med and then decided to stop. Does take aspirin daily. Hypothyroidism - previously on 1731m levothyroxine. TSH borderline elevated today - pt interested in restarting levothyroxine 7528mdaily.  Wt Readings from Last 3 Encounters:  08/03/13 218 lb (98.884 kg)  08/20/12 217 lb (98.431 kg)  07/30/12 215 lb 8 oz (97.75 kg)   Body mass index is 27.25 kg/(m^2).  Preventative: Colon cancer screening - aunt with colon cancer at age 39.57iscussed - requests stool kit.  Prostate cancer screening - has had in past. Gets checked yearly.  Tetanus - unsure - think around 2006  Flu shot - declines  Lives with daughter, 1 dog (golRestaurant manager, fast foodOccupation: claEditor, commissioningdu: BS  Activity: runs, bikes, swims, cross fit 5-6 d/wk Diet: good water, fruits/vegetables daily, "80% paleo"   Relevant past medical, surgical, family and social history reviewed and updated as indicated.  Allergies and medications reviewed and updated. Current Outpatient Prescriptions on File Prior to Visit  Medication Sig  . aspirin 81 MG tablet Take 81 mg by mouth daily.    No current facility-administered medications on file prior to visit.    Review of Systems  Constitutional: Negative for fever, chills, activity change, appetite change, fatigue and unexpected weight change.  HENT: Negative for hearing loss.   Eyes: Negative for visual disturbance.  Respiratory: Negative for cough, chest tightness, shortness of breath and wheezing.   Cardiovascular: Negative for chest pain, palpitations and leg swelling.    Gastrointestinal: Negative for nausea, vomiting, abdominal pain, diarrhea, constipation, blood in stool and abdominal distention.  Genitourinary: Negative for hematuria and difficulty urinating.  Musculoskeletal: Negative for arthralgias, myalgias and neck pain.  Skin: Negative for rash.  Neurological: Negative for dizziness, seizures, syncope and headaches.  Hematological: Negative for adenopathy. Does not bruise/bleed easily.  Psychiatric/Behavioral: Negative for dysphoric mood. The patient is not nervous/anxious.    Per HPI unless specifically indicated above    Objective:    BP 130/80  Pulse 60  Temp(Src) 98.2 F (36.8 C) (Oral)  Ht 6' 3"  (1.905 m)  Wt 218 lb (98.884 kg)  BMI 27.25 kg/m2  Physical Exam  Nursing note and vitals reviewed. Constitutional: He is oriented to person, place, and time. He appears well-developed and well-nourished. No distress.  HENT:  Head: Normocephalic and atraumatic.  Right Ear: Hearing, tympanic membrane, external ear and ear canal normal.  Left Ear: Hearing, tympanic membrane, external ear and ear canal normal.  Nose: Nose normal.  Mouth/Throat: Uvula is midline, oropharynx is clear and moist and mucous membranes are normal. No oropharyngeal exudate, posterior oropharyngeal edema or posterior oropharyngeal erythema.  Eyes: Conjunctivae and EOM are normal. Pupils are equal, round, and reactive to light. No scleral icterus.  Neck: Normal range of motion. Neck supple. No thyromegaly present.  Cardiovascular: Normal rate, regular rhythm, normal heart sounds and intact distal pulses.   No murmur heard. Pulses:      Radial pulses are 2+ on the right side, and  2+ on the left side.  Pulmonary/Chest: Effort normal and breath sounds normal. No respiratory distress. He has no wheezes. He has no rales.  Abdominal: Soft. Bowel sounds are normal. He exhibits no distension and no mass. There is no tenderness. There is no rebound and no guarding.   Genitourinary: Rectum normal and prostate normal. Rectal exam shows no external hemorrhoid, no internal hemorrhoid, no fissure, no mass, no tenderness and anal tone normal. Prostate is not enlarged and not tender.  Musculoskeletal: Normal range of motion. He exhibits no edema.  Lymphadenopathy:    He has no cervical adenopathy.  Neurological: He is alert and oriented to person, place, and time.  CN grossly intact, station and gait intact  Skin: Skin is warm and dry. No rash noted.  Psychiatric: He has a normal mood and affect. His behavior is normal. Judgment and thought content normal.   Results for orders placed in visit on 81/19/14  BASIC METABOLIC PANEL      Result Value Ref Range   Sodium 136  135 - 145 mEq/L   Potassium 4.2  3.5 - 5.1 mEq/L   Chloride 107  96 - 112 mEq/L   CO2 22  19 - 32 mEq/L   Glucose, Bld 91  70 - 99 mg/dL   BUN 21  6 - 23 mg/dL   Creatinine, Ser 1.1  0.4 - 1.5 mg/dL   Calcium 8.9  8.4 - 10.5 mg/dL   GFR 72.95  >60.00 mL/min  LIPID PANEL      Result Value Ref Range   Cholesterol 236 (*) 0 - 200 mg/dL   Triglycerides 86.0  0.0 - 149.0 mg/dL   HDL 43.20  >39.00 mg/dL   VLDL 17.2  0.0 - 40.0 mg/dL   LDL Cholesterol 176 (*) 0 - 99 mg/dL   Total CHOL/HDL Ratio 5    PSA      Result Value Ref Range   PSA 0.46  0.10 - 4.00 ng/mL  TSH      Result Value Ref Range   TSH 4.90 (*) 0.35 - 4.50 uIU/mL  T4, FREE      Result Value Ref Range   Free T4 0.70  0.60 - 1.60 ng/dL      Assessment & Plan:   Problem List Items Addressed This Visit   Hypothyroidism     Borderline low TSH in h/o hypothyroidism - restart levothyroxine at 49mg daily, recheck levels in 6 wks. Discussed levothyroxine use and brand vs generic precautions.    Relevant Medications      levothyroxine (SYNTHROID, LEVOTHROID) tablet   Other Relevant Orders      TSH      T4, Free   Hyperlipidemia     Off statin for 6-7 months.  Strong fmhx CAD. Encouraged restart.  Will send in 90d supply.  Pt agrees to restart lipitor.    Relevant Medications      atorvastatin (LIPITOR) tablet   Healthcare maintenance - Primary     Preventative protocols reviewed and updated unless pt declined. Discussed healthy diet and lifestyle.      Other Visit Diagnoses   Special screening for malignant neoplasms, colon        Relevant Orders       Fecal occult blood, imunochemical        Follow up plan: Return in about 1 year (around 08/04/2014), or as needed, for annual exam, prior fasting for blood work.

## 2013-08-03 NOTE — Patient Instructions (Signed)
Stool kit today. Restart lipitor 92m nightly. Start levothyroxine 760m daily 30 min before breakfast. Return in 6 weeks for recheck thyroid function (lab visit). Good to see you today, call usKoreaith questions. Return in 1 year for next physical or as needed.

## 2013-08-03 NOTE — Assessment & Plan Note (Signed)
Preventative protocols reviewed and updated unless pt declined. Discussed healthy diet and lifestyle.  

## 2013-08-03 NOTE — Assessment & Plan Note (Signed)
Off statin for 6-7 months.  Strong fmhx CAD. Encouraged restart.  Will send in 90d supply. Pt agrees to restart lipitor.

## 2013-08-03 NOTE — Assessment & Plan Note (Signed)
Borderline low TSH in h/o hypothyroidism - restart levothyroxine at daily, recheck levels in 6 wks. Discussed levothyroxine use and brand vs generic precautions.

## 2013-09-21 ENCOUNTER — Other Ambulatory Visit (INDEPENDENT_AMBULATORY_CARE_PROVIDER_SITE_OTHER): Payer: 59

## 2013-09-21 DIAGNOSIS — E039 Hypothyroidism, unspecified: Secondary | ICD-10-CM

## 2013-09-21 DIAGNOSIS — E038 Other specified hypothyroidism: Secondary | ICD-10-CM

## 2013-09-21 LAB — TSH: TSH: 9.17 u[IU]/mL — ABNORMAL HIGH (ref 0.35–4.50)

## 2013-09-21 LAB — T4, FREE: Free T4: 1.03 ng/dL (ref 0.60–1.60)

## 2013-09-22 ENCOUNTER — Other Ambulatory Visit: Payer: Self-pay | Admitting: Family Medicine

## 2013-09-22 DIAGNOSIS — E039 Hypothyroidism, unspecified: Secondary | ICD-10-CM

## 2013-09-22 MED ORDER — LEVOTHYROXINE SODIUM 100 MCG PO TABS: 100.0000 ug | ORAL_TABLET | Freq: Every day | ORAL | Status: DC

## 2013-09-28 ENCOUNTER — Telehealth: Payer: Self-pay

## 2013-09-28 MED ORDER — SYNTHROID 100 MCG PO TABS: 100.0000 ug | ORAL_TABLET | Freq: Every day | ORAL | Status: DC

## 2013-09-28 NOTE — Telephone Encounter (Signed)
Pt said levothyroxine is causing severe reflux, has chest pain and after belches feels better;symptoms started after starting levothyroxine pt said no cardiac symptoms; pt has family member that is NP and advised pt should try [facility]. Pt wants Dr Timoteo ExposeG's thoughts. Pt said he has to change levothyroxine. [facility] [address]. Pt request cb.

## 2013-09-28 NOTE — Telephone Encounter (Signed)
Would suggest trial of brand name synthroid daily - sent to pharmacy. If does not tolerate this, may try nature thyroid, but I prefer synthroid medication.

## 2013-09-29 NOTE — Telephone Encounter (Signed)
Patient notified and verbalized understanding. 

## 2013-11-04 ENCOUNTER — Other Ambulatory Visit (INDEPENDENT_AMBULATORY_CARE_PROVIDER_SITE_OTHER): Payer: 59

## 2013-11-04 DIAGNOSIS — E039 Hypothyroidism, unspecified: Secondary | ICD-10-CM

## 2013-11-04 LAB — TSH: TSH: 3.12 u[IU]/mL (ref 0.35–4.50)

## 2013-12-07 ENCOUNTER — Other Ambulatory Visit: Payer: Self-pay | Admitting: Family Medicine

## 2014-05-03 ENCOUNTER — Other Ambulatory Visit: Payer: Self-pay

## 2014-05-03 MED ORDER — SYNTHROID 100 MCG PO TABS: ORAL_TABLET | ORAL | Status: DC

## 2014-05-03 NOTE — Telephone Encounter (Signed)
Pt left v/m requesting to change pharmacy to [facility]. Pt has CPX scheduled 08/05/14 and last TSH 11/2013.pt advised refills done.

## 2014-06-24 NOTE — Op Note (Signed)
PATIENT NAME:  Jackson Williamson, Jackson Williamson MR#:  [id number] DATE OF BIRTH:  1955/08/14  DATE OF PROCEDURE:  05/29/2012  PREOPERATIVE DIAGNOSES: 1.  Right inguinal hernia.  2.  Umbilical hernia.   POSTOPERATIVE DIAGNOSIS:  1.  Right inguinal hernia.  2.  Umbilical hernia.  OPERATION: Laparoscopic repair right inguinal hernia and umbilical hernia, both with Physiomesh.  SURGEON:  Kathreen CosierS. G. Jabria Loos, M.D.   ANESTHESIA: General.   COMPLICATIONS: None.   ESTIMATED BLOOD LOSS: About 50 to 75 mL.  DRAINS:  None.   DESCRIPTION OF PROCEDURE: The patient was put to sleep in the supine position on the operating table. The abdomen was prepped and draped out as a sterile field after a Foley catheter was inserted.  The Foley was removed at the end of the procedure.  A timeout procedure was performed. Initial entry was made in the left upper quadrant area just below the costal margin. A small incision was made. A Veress needle was positioned in the peritoneal cavity and verified with the hanging drop method. Pneumoperitoneum was obtained. An 11 mm XL port was placed. The camera was introduced with good visualization of the peritoneal cavity. No apparent injury to the underlying structures from initial entry was noted.  An additional 11 mm port was placed high in the epigastric region and a 5 mm port in the left mid to lower abdomen.  In the right inguinal canal, there was a large opening constituting an indirect hernia. No contents were going into this hernia at this point. The attention was directed to the inguinal canal. With exposure and visualization, the peritoneum overlying the inguinal canal was cut with cautery transversely from the pubic tubercle all the way across lateral to the internal ring. The peritoneum was then reflected off the posterior wall.  In the course of dissection, a bleeding vessel in the internal oblique muscle lateral to the internal ring was encountered with some minimal bleeding associated with  this. This was adequately exposed, hemoclipped and controlled.  After irrigating out the area, some of the clotted blood was  suctioned out.  The inguinal canal was satisfactorily re-exposed.  The pubic tubercle with ligament area was exposed in the inguinal area and visualized. The indirect sac was not dissected as the sac went all the way almost down to the scrotum. It was therefore transected at the abdominal internal ring area.  The vas deferens were identified and preserved.   Following this, a 10 x 15 cm Physiomesh was brought out, placed into the peritoneal cavity, placed across the inguinal ligament and the secure strap was used to tack this down to the pubic tubercle and muscular tissue medially and the inguinal ligament below and the fascia tissues superiorly with 1 tack placed lateral to the internal ring. This provided an adequate coverage of the internal ring and the posterior wall. The peritoneum was then reapproximated again using securestrapfor re-attachment.   Following this, the umbilical hernia was repaired.  There were no contents in umbilical hernia which was about a little over a fingerbreadth size opening in the mid portion of the umbilicus.  Again, a 10 x 15 Physiomesh was brought up.  This was trimmed about 1 cm all around to allow for an adequate coverage of the umbilical defect.  This was placed in the peritoneal cavity and placed up against the hernia with the central portion placed over the umnilical defect.  A secure strap was used to tack it down all around and  tiny stab  incisions in the superior and inferior and the 2 lateral aspects. Then 0 Prolene passed through with a spinal needle and brought up and tied down to secure the mesh in place. After ensuring hemostasis, the pneumoperitoneum was released and the air from the inguinal canal sac was also relieved with pressure.  The ports were removed. The fascial opening in the epigastric port site closed with 0 Vicryl. All the skin  incisions were closed with subcuticular 4-0 Vicryl, covered with Dermabond. The procedure was well tolerated. He was subsequently extubated and returned to the recovery room in stable condition    ____________________________ S.Wynona Luna, MD sgs:ct D: 05/29/2012 17:06:41 ET T: 05/30/2012 08:44:42 ET JOB#: [id number]  cc: Timoteo Expose. Evette Cristal, MD, <Dictator> [facility] Wynona Luna MD ELECTRONICALLY SIGNED 06/01/2012 7:21

## 2014-07-27 ENCOUNTER — Telehealth: Payer: Self-pay | Admitting: Family Medicine

## 2014-07-27 DIAGNOSIS — Z113 Encounter for screening for infections with a predominantly sexual mode of transmission: Secondary | ICD-10-CM

## 2014-07-27 NOTE — Telephone Encounter (Signed)
ordered

## 2014-07-27 NOTE — Addendum Note (Signed)
Addended by: Eustaquio BoydenGUTIERREZ, Rafik Koppel on: 07/27/2014 01:19 PM   Modules accepted: Orders

## 2014-07-27 NOTE — Telephone Encounter (Signed)
Patient is coming in for lab work on 07/29/14 for his cpx.  Patient called and asked for his lab work to include checking for STD.

## 2014-07-28 ENCOUNTER — Other Ambulatory Visit (INDEPENDENT_AMBULATORY_CARE_PROVIDER_SITE_OTHER): Payer: 59

## 2014-07-28 ENCOUNTER — Other Ambulatory Visit: Payer: Self-pay | Admitting: Family Medicine

## 2014-07-28 DIAGNOSIS — Z113 Encounter for screening for infections with a predominantly sexual mode of transmission: Secondary | ICD-10-CM

## 2014-07-29 ENCOUNTER — Other Ambulatory Visit: Payer: Self-pay

## 2014-07-29 ENCOUNTER — Other Ambulatory Visit: Payer: Self-pay | Admitting: Family Medicine

## 2014-07-29 DIAGNOSIS — E039 Hypothyroidism, unspecified: Secondary | ICD-10-CM

## 2014-07-29 DIAGNOSIS — E785 Hyperlipidemia, unspecified: Secondary | ICD-10-CM

## 2014-07-29 DIAGNOSIS — Z125 Encounter for screening for malignant neoplasm of prostate: Secondary | ICD-10-CM

## 2014-07-29 LAB — BASIC METABOLIC PANEL

## 2014-07-29 LAB — TSH

## 2014-07-29 LAB — RPR

## 2014-07-29 LAB — GC/CHLAMYDIA PROBE AMP, URINE
Chlamydia, Swab/Urine, PCR: NEGATIVE
GC Probe Amp, Urine: NEGATIVE

## 2014-07-29 LAB — LIPID PANEL

## 2014-07-29 LAB — HIV ANTIBODY (ROUTINE TESTING W REFLEX): HIV 1&2 Ab, 4th Generation: NONREACTIVE

## 2014-07-29 LAB — PSA

## 2014-08-05 ENCOUNTER — Ambulatory Visit (INDEPENDENT_AMBULATORY_CARE_PROVIDER_SITE_OTHER): Payer: 59 | Admitting: Family Medicine

## 2014-08-05 ENCOUNTER — Encounter: Payer: Self-pay | Admitting: Family Medicine

## 2014-08-05 VITALS — BP 130/90 | HR 64 | Temp 98.1°F | Ht 75.0 in | Wt 219.5 lb

## 2014-08-05 DIAGNOSIS — E785 Hyperlipidemia, unspecified: Secondary | ICD-10-CM | POA: Diagnosis not present

## 2014-08-05 DIAGNOSIS — Z125 Encounter for screening for malignant neoplasm of prostate: Secondary | ICD-10-CM | POA: Diagnosis not present

## 2014-08-05 DIAGNOSIS — E039 Hypothyroidism, unspecified: Secondary | ICD-10-CM

## 2014-08-05 DIAGNOSIS — K219 Gastro-esophageal reflux disease without esophagitis: Secondary | ICD-10-CM

## 2014-08-05 DIAGNOSIS — Z0000 Encounter for general adult medical examination without abnormal findings: Secondary | ICD-10-CM

## 2014-08-05 DIAGNOSIS — H02402 Unspecified ptosis of left eyelid: Secondary | ICD-10-CM

## 2014-08-05 DIAGNOSIS — H02409 Unspecified ptosis of unspecified eyelid: Secondary | ICD-10-CM | POA: Insufficient documentation

## 2014-08-05 LAB — BASIC METABOLIC PANEL
BUN: 17 mg/dL (ref 6–23)
CO2: 26 meq/L (ref 19–32)
CREATININE: 1.11 mg/dL (ref 0.40–1.50)
Calcium: 9.8 mg/dL (ref 8.4–10.5)
Chloride: 105 mEq/L (ref 96–112)
GFR: 72.68 mL/min (ref >60.00)
GLUCOSE: 95 mg/dL (ref 70–99)
Potassium: 4.2 mEq/L (ref 3.5–5.1)
SODIUM: 137 meq/L (ref 135–145)

## 2014-08-05 LAB — LIPID PANEL
Cholesterol: 248 mg/dL — ABNORMAL HIGH (ref 0–200)
HDL: 44.4 mg/dL (ref >39.00)
LDL CALC: 178 mg/dL — AB (ref 0–99)
NonHDL: 203.6
Total CHOL/HDL Ratio: 6
Triglycerides: 130 mg/dL (ref 0.0–149.0)
VLDL: 26 mg/dL (ref 0.0–40.0)

## 2014-08-05 LAB — HEPATIC FUNCTION PANEL
ALT: 17 U/L (ref 0–53)
AST: 12 U/L (ref 0–37)
Albumin: 4.3 g/dL (ref 3.5–5.2)
Alkaline Phosphatase: 61 U/L (ref 39–117)
Bilirubin, Direct: 0.1 mg/dL (ref 0.0–0.3)
Total Bilirubin: 0.7 mg/dL (ref 0.2–1.2)
Total Protein: 6.9 g/dL (ref 6.0–8.3)

## 2014-08-05 LAB — PSA: PSA: 0.43 ng/mL (ref 0.10–4.00)

## 2014-08-05 LAB — TSH: TSH: 4.21 u[IU]/mL (ref 0.35–4.50)

## 2014-08-05 MED ORDER — PRAVASTATIN SODIUM 40 MG PO TABS: 40.0000 mg | ORAL_TABLET | Freq: Every day | ORAL | Status: DC

## 2014-08-05 NOTE — Assessment & Plan Note (Addendum)
Off lipitor for last few months. Strong fmhx CAD. Encouraged start less potent statin. Concerned about statin affecting liver.

## 2014-08-05 NOTE — Patient Instructions (Addendum)
Start pravastatin 40mg  daily (less potent cholesterol medication). Blood work today. We will call you with results. Return as needed or in 1 year for next physical

## 2014-08-05 NOTE — Assessment & Plan Note (Signed)
Recheck TSH today.  

## 2014-08-05 NOTE — Progress Notes (Signed)
Pre visit review using our clinic review tool, if applicable. No additional management support is needed unless otherwise documented below in the visit note. 

## 2014-08-05 NOTE — Assessment & Plan Note (Signed)
Preventative protocols reviewed and updated unless pt declined. Discussed healthy diet and lifestyle.  

## 2014-08-05 NOTE — Assessment & Plan Note (Addendum)
Ongoing over last several years. Denies any other neurological sxs. Neurological exam nonfocal today. Continue to monitor.

## 2014-08-05 NOTE — Assessment & Plan Note (Signed)
Resolved off generic levothyroxine.

## 2014-08-05 NOTE — Progress Notes (Signed)
BP 130/90 mmHg  Pulse 64  Temp(Src) 98.1 F (36.7 C) (Oral)  Ht 6' 3"  (1.905 m)  Wt 219 lb 8 oz (99.565 kg)  BMI 27.44 kg/m2   CC: CPE  Subjective:    Patient ID: Jackson Williamson, male    DOB: 21-Apr-1955, 59 y.o.   MRN: [id number]  HPI: Jackson Williamson is a 59 y.o. male presenting on 08/05/2014 for Annual Exam   New relationship with dentist from [place] going well.  Strong fmhx CAD. Takes aspirin and lipitor 57m. Stopped lipitor several months ago. Discussed fmhx. Willing to restart lower potency statin.  Hypothyroidism - compliant with 1085m synthroid. Generic levothyroxine caused GERD.   L eyelid drooping over last few years. Droops more as day progresses. Bright sunlight worsens this. Doesn't affect visit. No numbness or paresthesias, unilateral weakness, slurred speech or confusion.  Preventative: Colon cancer screening - aunt with colon cancer at age 3635Stool kit from last year brought in today. Prostate cancer screening - Checked yearly due to fmhx (uncle). Normal previously. Tetanus - 2006. Declines for now Flu shot - declines Seat belt use discussed Sunscreen use discussed.  Lives with daughter, 1 dog (goRestaurant manager, fast food Occupation: clEditor, commissioningEdu: BS  Activity: runs, bikes, swims, cross fit 5-6 d/wk Diet: good water, fruits/vegetables daily, "80% paleo"   Relevant past medical, surgical, family and social history reviewed and updated as indicated. Interim medical history since our last visit reviewed. Allergies and medications reviewed and updated. Current Outpatient Prescriptions on File Prior to Visit  Medication Sig  . aspirin 81 MG tablet Take 81 mg by mouth daily.   . Marland KitchenYNTHROID 100 MCG tablet TAKE ONE TABLET BY MOUTH ONCE DAILY BEFORE  BREAKFAST   No current facility-administered medications on file prior to visit.    Review of Systems  Constitutional: Negative for fever, chills, activity change, appetite change, fatigue and unexpected weight  change.  HENT: Negative for hearing loss.   Eyes: Negative for visual disturbance.  Respiratory: Negative for cough, chest tightness, shortness of breath and wheezing.   Cardiovascular: Negative for chest pain, palpitations and leg swelling.  Gastrointestinal: Negative for nausea, vomiting, abdominal pain, diarrhea, constipation, blood in stool and abdominal distention.  Genitourinary: Negative for hematuria and difficulty urinating.  Musculoskeletal: Negative for myalgias, arthralgias and neck pain.  Skin: Negative for rash.  Neurological: Positive for headaches (occasional). Negative for dizziness, seizures and syncope.  Hematological: Negative for adenopathy. Does not bruise/bleed easily.  Psychiatric/Behavioral: Negative for dysphoric mood. The patient is not nervous/anxious.    Per HPI unless specifically indicated above     Objective:    BP 130/90 mmHg  Pulse 64  Temp(Src) 98.1 F (36.7 C) (Oral)  Ht 6' 3"  (1.905 m)  Wt 219 lb 8 oz (99.565 kg)  BMI 27.44 kg/m2  Wt Readings from Last 3 Encounters:  08/05/14 219 lb 8 oz (99.565 kg)  08/03/13 218 lb (98.884 kg)  08/20/12 217 lb (98.431 kg)    Physical Exam  Constitutional: He is oriented to person, place, and time. He appears well-developed and well-nourished. No distress.  HENT:  Head: Normocephalic and atraumatic.  Right Ear: Hearing, tympanic membrane, external ear and ear canal normal.  Left Ear: Hearing, tympanic membrane, external ear and ear canal normal.  Nose: Nose normal.  Mouth/Throat: Uvula is midline, oropharynx is clear and moist and mucous membranes are normal. No oropharyngeal exudate, posterior oropharyngeal edema or posterior oropharyngeal erythema.  Eyes: Conjunctivae and EOM are  normal. Pupils are equal, round, and reactive to light. No scleral icterus.  Slight drooping of left eye lid. No eyelid fatigue with fixed upward gaze.  Neck: Normal range of motion. Neck supple. No thyromegaly present.    Cardiovascular: Normal rate, regular rhythm, normal heart sounds and intact distal pulses.   No murmur heard. Pulses:      Radial pulses are 2+ on the right side, and 2+ on the left side.  Pulmonary/Chest: Effort normal and breath sounds normal. No respiratory distress. He has no wheezes. He has no rales.  Abdominal: Soft. Bowel sounds are normal. He exhibits no distension and no mass. There is no tenderness. There is no rebound and no guarding.  Genitourinary: Rectum normal. Rectal exam shows no external hemorrhoid, no internal hemorrhoid, no fissure, no mass, no tenderness and anal tone normal. Prostate is enlarged (25gm mildly enlarged). Prostate is not tender.  Musculoskeletal: Normal range of motion. He exhibits no edema.  Lymphadenopathy:    He has no cervical adenopathy.  Neurological: He is alert and oriented to person, place, and time. He has normal strength. No cranial nerve deficit or sensory deficit. Coordination and gait normal.  CN 2-12 intact Station and gait intact  Skin: Skin is warm and dry. No rash noted.  Psychiatric: He has a normal mood and affect. His behavior is normal. Judgment and thought content normal.  Nursing note and vitals reviewed.  Results for orders placed or performed in visit on 07/28/14  GC/chlamydia probe amp, urine  Result Value Ref Range   Chlamydia, Swab/Urine, PCR NEGATIVE NEGATIVE   GC Probe Amp, Urine NEGATIVE NEGATIVE  RPR  Result Value Ref Range   RPR Ser Ql NON REAC NON REAC  HIV antibody  Result Value Ref Range   HIV 1&2 Ab, 4th Generation NONREACTIVE NONREACTIVE  QNS for other labwork - will recheck today.    Assessment & Plan:   Problem List Items Addressed This Visit    Drooping eyelid    Ongoing over last several years. Denies any other neurological sxs. Neurological exam nonfocal today. Continue to monitor.      GERD (gastroesophageal reflux disease)    Resolved off generic levothyroxine.      Healthcare maintenance -  Primary    Preventative protocols reviewed and updated unless pt declined. Discussed healthy diet and lifestyle.       Hyperlipidemia    Off lipitor for last few months. Strong fmhx CAD. Encouraged start less potent statin. Concerned about statin affecting liver.      Relevant Medications   pravastatin (PRAVACHOL) 40 MG tablet   Other Relevant Orders   Hepatic Function Panel   Hypothyroidism    Recheck TSH today.        Other Visit Diagnoses    Special screening for malignant neoplasm of prostate            Follow up plan: Return in about 1 year (around 08/05/2015), or as needed, for annual exam, prior fasting for blood work.

## 2014-08-08 ENCOUNTER — Telehealth: Payer: Self-pay | Admitting: Family Medicine

## 2014-08-08 ENCOUNTER — Other Ambulatory Visit (INDEPENDENT_AMBULATORY_CARE_PROVIDER_SITE_OTHER): Payer: 59

## 2014-08-08 DIAGNOSIS — Z1211 Encounter for screening for malignant neoplasm of colon: Secondary | ICD-10-CM | POA: Diagnosis not present

## 2014-08-08 LAB — FECAL OCCULT BLOOD, GUAIAC: Fecal Occult Blood: NEGATIVE

## 2014-08-08 NOTE — Telephone Encounter (Signed)
Ordered entered 

## 2014-08-08 NOTE — Telephone Encounter (Signed)
[facility]Kathy [facility],. Thanks

## 2014-08-09 LAB — FECAL OCCULT BLOOD, IMMUNOCHEMICAL: Fecal Occult Bld: NEGATIVE

## 2014-08-11 ENCOUNTER — Encounter: Payer: Self-pay | Admitting: *Deleted

## 2014-09-16 ENCOUNTER — Ambulatory Visit (INDEPENDENT_AMBULATORY_CARE_PROVIDER_SITE_OTHER): Payer: 59 | Admitting: Family Medicine

## 2014-09-16 ENCOUNTER — Encounter: Payer: Self-pay | Admitting: Family Medicine

## 2014-09-16 ENCOUNTER — Other Ambulatory Visit: Payer: Self-pay | Admitting: Family Medicine

## 2014-09-16 VITALS — BP 130/88 | HR 64 | Temp 97.4°F | Wt 224.5 lb

## 2014-09-16 DIAGNOSIS — H9190 Unspecified hearing loss, unspecified ear: Secondary | ICD-10-CM | POA: Insufficient documentation

## 2014-09-16 DIAGNOSIS — N529 Male erectile dysfunction, unspecified: Secondary | ICD-10-CM | POA: Insufficient documentation

## 2014-09-16 DIAGNOSIS — H9193 Unspecified hearing loss, bilateral: Secondary | ICD-10-CM | POA: Diagnosis not present

## 2014-09-16 MED ORDER — SILDENAFIL CITRATE 100 MG PO TABS: 100.0000 mg | ORAL_TABLET | Freq: Every day | ORAL | Status: DC | PRN

## 2014-09-16 MED ORDER — SILDENAFIL CITRATE 20 MG PO TABS: 40.0000 mg | ORAL_TABLET | ORAL | Status: DC | PRN

## 2014-09-16 NOTE — Progress Notes (Signed)
Pre visit review using our clinic review tool, if applicable. No additional management support is needed unless otherwise documented below in the visit note. 

## 2014-09-16 NOTE — Progress Notes (Signed)
BP 130/88 mmHg  Pulse 64  Temp(Src) 97.4 F (36.3 C) (Oral)  Wt 224 lb 8 oz (101.833 kg)   CC: ED, hearing trouble Subjective:    Patient ID: Jackson Williamson, male    DOB: 07/08/1955, 59 y.o.   MRN: [id number]  HPI: Jackson Williamson is a 59 y.o. male presenting on 09/16/2014 for Erectile Dysfunction and Hearing Problem   Over last month noticing trouble maintaining erection. New relationship. More trouble in the morning. Prior not sexually active in the past year.   Several years ago saw Dr Mariah MillingGollan. Did not have stress test done. Denies chest pain or tightness, dyspnea. Endorses normal stress echo at age 59 yo.   Lab Results  Component Value Date   CHOL 248* 08/05/2014   HDL 44.40 08/05/2014   LDLCALC 178* 08/05/2014   TRIG 130.0 08/05/2014   CHOLHDL 6 08/05/2014     Hearing trouble noted mainly with background noise. Mainly high pitch. Trouble worse out of right ear.   Relevant past medical, surgical, family and social history reviewed and updated as indicated. Interim medical history since our last visit reviewed. Allergies and medications reviewed and updated. Current Outpatient Prescriptions on File Prior to Visit  Medication Sig  . aspirin 81 MG tablet Take 81 mg by mouth daily.   . pravastatin (PRAVACHOL) 40 MG tablet Take 1 tablet (40 mg total) by mouth daily.  Marland Kitchen. SYNTHROID 100 MCG tablet TAKE ONE TABLET BY MOUTH ONCE DAILY BEFORE  BREAKFAST   No current facility-administered medications on file prior to visit.    Review of Systems Per HPI unless specifically indicated above     Objective:    BP 130/88 mmHg  Pulse 64  Temp(Src) 97.4 F (36.3 C) (Oral)  Wt 224 lb 8 oz (101.833 kg)  Wt Readings from Last 3 Encounters:  09/16/14 224 lb 8 oz (101.833 kg)  08/05/14 219 lb 8 oz (99.565 kg)  08/03/13 218 lb (98.884 kg)    Physical Exam  Constitutional: He appears well-developed and well-nourished. No distress.  HENT:  Right Ear: Hearing, tympanic membrane,  external ear and ear canal normal.  Left Ear: Hearing, tympanic membrane, external ear and ear canal normal.  Abdominal: Hernia confirmed negative in the right inguinal area and confirmed negative in the left inguinal area.  Genitourinary: Testes normal and penis normal. Right testis shows no mass, no swelling and no tenderness. Right testis is descended. Left testis shows no mass, no swelling and no tenderness. Left testis is descended.  ?small R hydrocele  Lymphadenopathy:       Right: No inguinal adenopathy present.       Left: No inguinal adenopathy present.  Nursing note and vitals reviewed.  Results for orders placed or performed in visit on 08/11/14  Fecal Occult Blood, Guaiac  Result Value Ref Range   Fecal Occult Blood Negative       Assessment & Plan:   Problem List Items Addressed This Visit    ED (erectile dysfunction) - Primary    Discussed pathophysiology as well as treatment. 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 Will trial viagra 50-100mg  prn, coupon provided. Provided with printed script for generic sildenafil. Discussed monitoring for priapism, headache, cannot take nitrates with PDE5, and to seek urgent care if any chest pain develops given strong fmhx (but does have recent reassuring stress echo per pt report). Pt agrees with plan.      Hearing loss    R>L, minimal. Passes  hearing screen today. Declines audiology referral.          Follow up plan: Return if symptoms worsen or fail to improve.

## 2014-09-16 NOTE — Patient Instructions (Signed)
Trial viagra for ED symptoms. Take 1/2 tablet as needed. Let us know if you want formal hearing evaluation

## 2014-09-16 NOTE — Assessment & Plan Note (Signed)
Discussed pathophysiology as well as treatment. 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 Will trial viagra 50-100mg  prn, coupon provided. Provided with printed script for generic sildenafil. Discussed monitoring for priapism, headache, cannot take nitrates with PDE5, and to seek urgent care if any chest pain develops given strong fmhx (but does have recent reassuring stress echo per pt report). Pt agrees with plan.

## 2014-09-16 NOTE — Assessment & Plan Note (Signed)
R>L, minimal. Passes hearing screen today. Declines audiology referral.

## 2014-10-19 ENCOUNTER — Telehealth: Payer: Self-pay

## 2014-10-19 DIAGNOSIS — H9193 Unspecified hearing loss, bilateral: Secondary | ICD-10-CM

## 2014-10-19 NOTE — Telephone Encounter (Signed)
referral placed

## 2014-10-19 NOTE — Telephone Encounter (Signed)
Pt left v/m requesting referral to hearing specialist; pt has discussed with Dr Reece Agar at annual exam in 08/2014.Please advise.

## 2014-10-20 NOTE — Telephone Encounter (Signed)
10/19/14-faxed referral to [facility]/waiting for appt to be made -arc

## 2014-12-09 ENCOUNTER — Other Ambulatory Visit: Payer: Self-pay | Admitting: Family Medicine

## 2014-12-09 IMAGING — CR DG CHEST 2V
1 series · 2 of 2 positions shown · non-contrast
Comparison: none

REASON FOR EXAM: chest pain
COMMENTS:   May transport without cardiac monitor

[Series 1: w chest pa · 0.14mm/px · 2 of 2 slices shown]
[im 1/2]
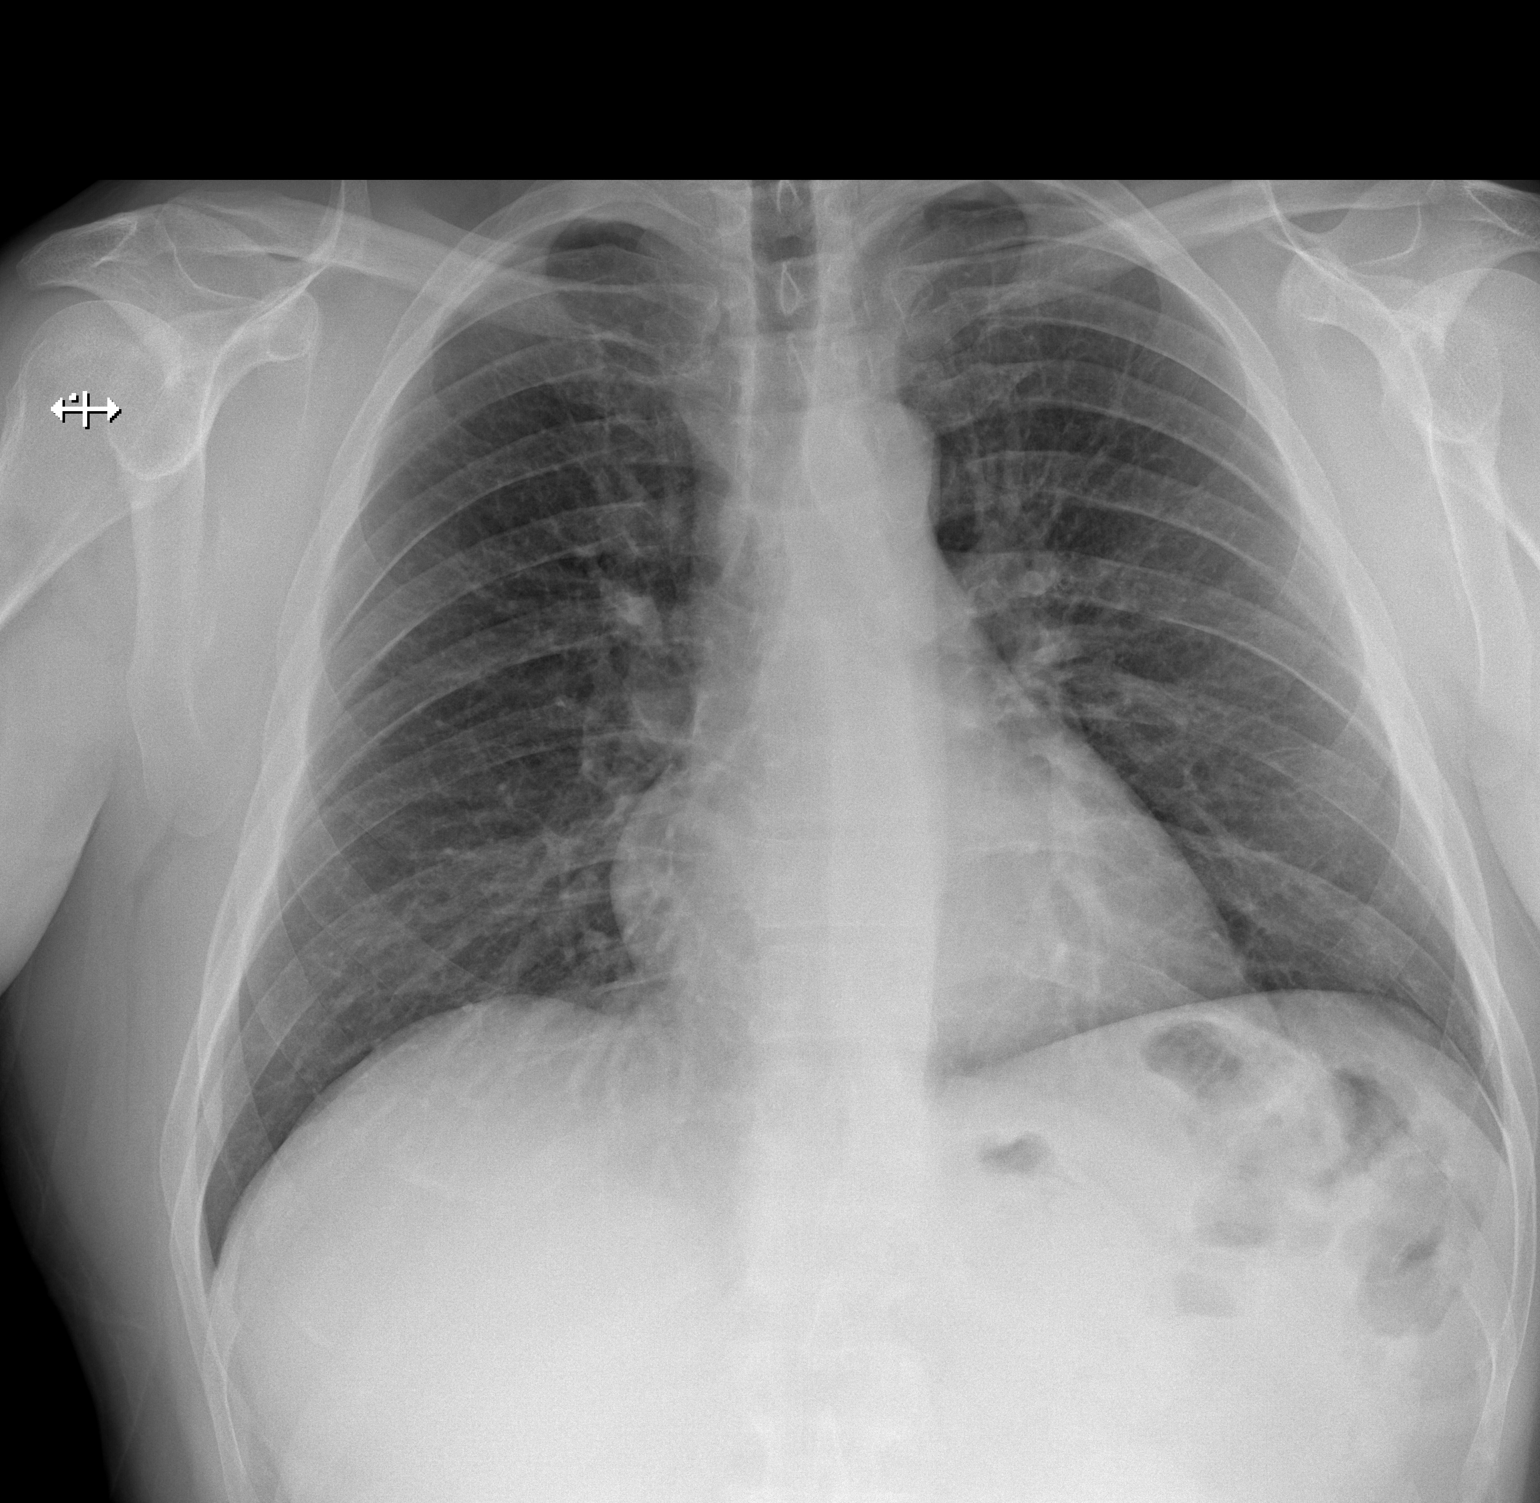
[im 2/2]
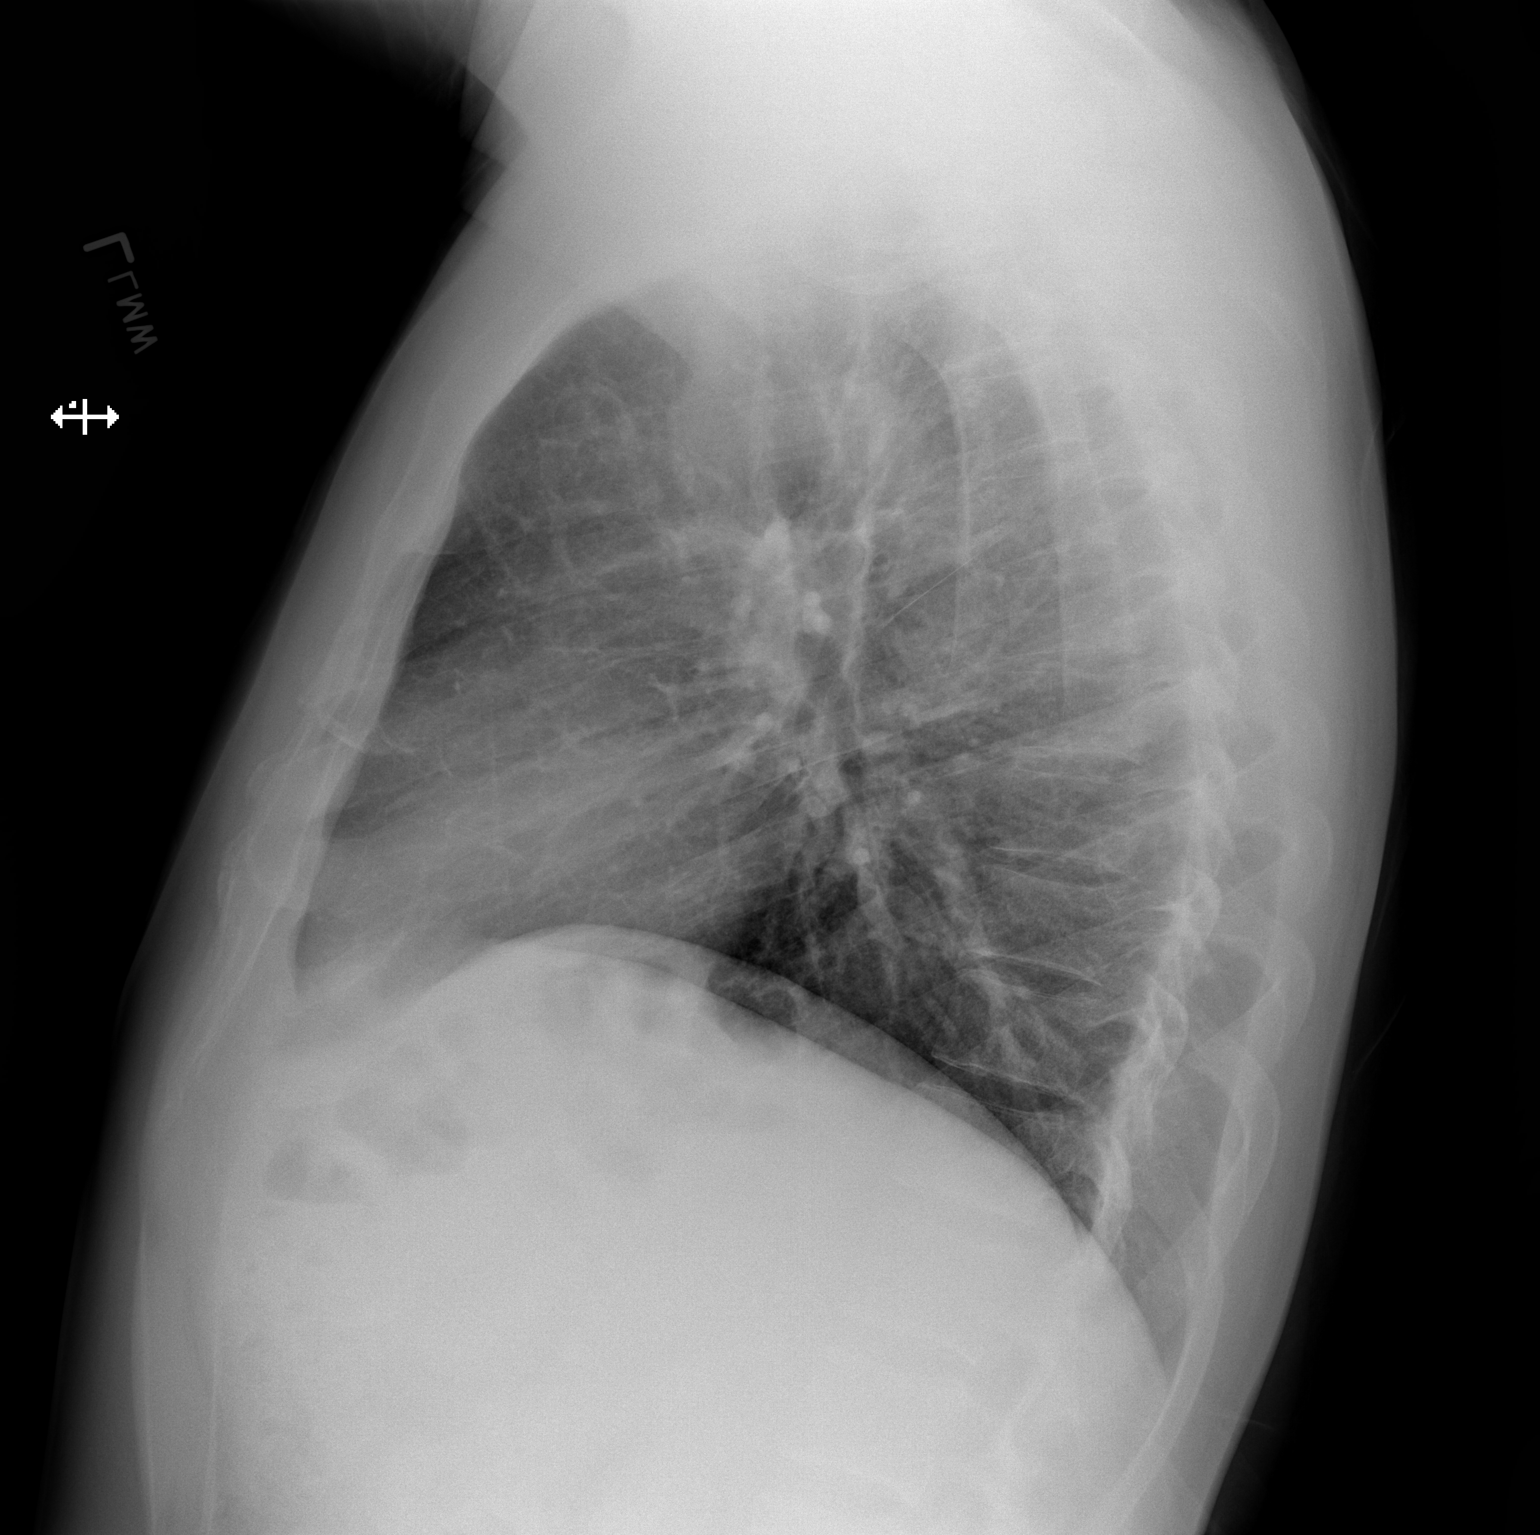

[2 of 2 positions shown; findings below may reference images not displayed]

PROCEDURE:     DXR - DXR CHEST PA (OR AP) AND LATERAL  - February 28, 2012  [DATE]

RESULT:     Comparison is made to the study September 29, 2008.

The lungs are adequately inflated. The interstitial markings are mildly
increased bilaterally. The cardiac silhouette is normal in size. The
mediastinum is normal in width. There is no pleural effusion.
IMPRESSION: There are mildly increased interstitial markings which may
in reflect interstitial edema of cardiac or noncardiac cause. There is no
alveolar pneumonia nor evidence of pleural or effusions. Followup imaging is
available upon reque[REDACTED]

## 2014-12-09 NOTE — Telephone Encounter (Signed)
Ok to refill? He has Rx's for both this and viagra. I was confused. Thanks!

## 2015-01-24 ENCOUNTER — Other Ambulatory Visit: Payer: Self-pay | Admitting: Family Medicine

## 2015-03-01 ENCOUNTER — Other Ambulatory Visit: Payer: Self-pay | Admitting: Family Medicine

## 2015-06-15 ENCOUNTER — Other Ambulatory Visit: Payer: Self-pay | Admitting: Family Medicine

## 2015-06-15 NOTE — Telephone Encounter (Signed)
Pt left v/m requesting refill synthroid to [facility]; refill per protocol; last TSH 06/16. Pt notified refill done and pt voiced understanding.

## 2015-08-05 ENCOUNTER — Other Ambulatory Visit: Payer: Self-pay | Admitting: Family Medicine

## 2015-08-05 DIAGNOSIS — Z1159 Encounter for screening for other viral diseases: Secondary | ICD-10-CM

## 2015-08-05 DIAGNOSIS — E785 Hyperlipidemia, unspecified: Secondary | ICD-10-CM

## 2015-08-05 DIAGNOSIS — Z125 Encounter for screening for malignant neoplasm of prostate: Secondary | ICD-10-CM

## 2015-08-05 DIAGNOSIS — E039 Hypothyroidism, unspecified: Secondary | ICD-10-CM

## 2015-08-07 ENCOUNTER — Other Ambulatory Visit: Payer: 59

## 2015-08-09 ENCOUNTER — Encounter: Payer: 59 | Admitting: Family Medicine

## 2015-08-18 ENCOUNTER — Encounter: Payer: 59 | Admitting: Family Medicine

## 2019-05-03 ENCOUNTER — Inpatient Hospital Stay (HOSPITAL_COMMUNITY)
Admit: 2019-05-03 | Discharge: 2019-05-03 | Disposition: A | Discharge status: Home / Self Care | Payer: 59 | Attending: Cardiovascular Disease | Admitting: Cardiovascular Disease

## 2019-05-03 ENCOUNTER — Encounter
Admission: EM | Disposition: A | Discharge status: Home / Self Care | Payer: Self-pay | Source: Home / Self Care | Attending: Cardiovascular Disease

## 2019-05-03 ENCOUNTER — Other Ambulatory Visit: Payer: Self-pay

## 2019-05-03 ENCOUNTER — Inpatient Hospital Stay
Admission: EM | Admit: 2019-05-03 | Discharge: 2019-05-05 | DRG: 247 | Disposition: A | Discharge status: Home / Self Care | Payer: 59 | Attending: Cardiology | Admitting: Cardiology

## 2019-05-03 DIAGNOSIS — Z20822 Contact with and (suspected) exposure to covid-19: Secondary | ICD-10-CM | POA: Diagnosis present

## 2019-05-03 DIAGNOSIS — I25118 Atherosclerotic heart disease of native coronary artery with other forms of angina pectoris: Secondary | ICD-10-CM

## 2019-05-03 DIAGNOSIS — E785 Hyperlipidemia, unspecified: Secondary | ICD-10-CM | POA: Diagnosis present

## 2019-05-03 DIAGNOSIS — I2119 ST elevation (STEMI) myocardial infarction involving other coronary artery of inferior wall: Secondary | ICD-10-CM | POA: Diagnosis present

## 2019-05-03 DIAGNOSIS — I959 Hypotension, unspecified: Secondary | ICD-10-CM | POA: Diagnosis present

## 2019-05-03 DIAGNOSIS — Z8349 Family history of other endocrine, nutritional and metabolic diseases: Secondary | ICD-10-CM | POA: Diagnosis not present

## 2019-05-03 DIAGNOSIS — Z888 Allergy status to other drugs, medicaments and biological substances status: Secondary | ICD-10-CM

## 2019-05-03 DIAGNOSIS — E039 Hypothyroidism, unspecified: Secondary | ICD-10-CM | POA: Diagnosis present

## 2019-05-03 DIAGNOSIS — E782 Mixed hyperlipidemia: Secondary | ICD-10-CM | POA: Diagnosis not present

## 2019-05-03 DIAGNOSIS — Z7982 Long term (current) use of aspirin: Secondary | ICD-10-CM

## 2019-05-03 DIAGNOSIS — Z809 Family history of malignant neoplasm, unspecified: Secondary | ICD-10-CM

## 2019-05-03 DIAGNOSIS — I5189 Other ill-defined heart diseases: Secondary | ICD-10-CM

## 2019-05-03 DIAGNOSIS — Z79890 Hormone replacement therapy (postmenopausal): Secondary | ICD-10-CM | POA: Diagnosis not present

## 2019-05-03 DIAGNOSIS — R079 Chest pain, unspecified: Secondary | ICD-10-CM | POA: Diagnosis not present

## 2019-05-03 DIAGNOSIS — K219 Gastro-esophageal reflux disease without esophagitis: Secondary | ICD-10-CM | POA: Diagnosis present

## 2019-05-03 DIAGNOSIS — I2510 Atherosclerotic heart disease of native coronary artery without angina pectoris: Secondary | ICD-10-CM | POA: Diagnosis present

## 2019-05-03 DIAGNOSIS — Z79899 Other long term (current) drug therapy: Secondary | ICD-10-CM

## 2019-05-03 DIAGNOSIS — I213 ST elevation (STEMI) myocardial infarction of unspecified site: Secondary | ICD-10-CM

## 2019-05-03 DIAGNOSIS — Z8249 Family history of ischemic heart disease and other diseases of the circulatory system: Secondary | ICD-10-CM | POA: Diagnosis not present

## 2019-05-03 HISTORY — PX: CORONARY/GRAFT ACUTE MI REVASCULARIZATION: CATH118305

## 2019-05-03 HISTORY — DX: Atherosclerotic heart disease of native coronary artery without angina pectoris: I25.10

## 2019-05-03 HISTORY — PX: LEFT HEART CATH AND CORONARY ANGIOGRAPHY: CATH118249

## 2019-05-03 HISTORY — DX: Other ill-defined heart diseases: I51.89

## 2019-05-03 LAB — BASIC METABOLIC PANEL
Anion gap: 7 (ref 5–15)
BUN: 24 mg/dL — ABNORMAL HIGH (ref 8–23)
CO2: 26 mmol/L (ref 22–32)
Calcium: 9.6 mg/dL (ref 8.9–10.3)
Chloride: 106 mmol/L (ref 98–111)
Creatinine, Ser: 1.26 mg/dL — ABNORMAL HIGH (ref 0.61–1.24)
GFR calc Af Amer: >60 mL/min (ref >60)
GFR calc non Af Amer: >60 mL/min (ref >60)
Glucose, Bld: 108 mg/dL — ABNORMAL HIGH (ref 70–99)
Potassium: 3.8 mmol/L (ref 3.5–5.1)
Sodium: 139 mmol/L (ref 135–145)

## 2019-05-03 LAB — CBC
HCT: 44.7 % (ref 39.0–52.0)
Hemoglobin: 15 g/dL (ref 13.0–17.0)
MCH: 30.1 pg (ref 26.0–34.0)
MCHC: 33.6 g/dL (ref 30.0–36.0)
MCV: 89.8 fL (ref 80.0–100.0)
Platelets: 276 10*3/uL (ref 150–400)
RBC: 4.98 MIL/uL (ref 4.22–5.81)
RDW: 12.7 % (ref 11.5–15.5)
WBC: 9.4 10*3/uL (ref 4.0–10.5)
nRBC: 0 % (ref 0.0–0.2)

## 2019-05-03 LAB — CBC WITH DIFFERENTIAL/PLATELET
Abs Immature Granulocytes: 0.03 10*3/uL (ref 0.00–0.07)
Basophils Absolute: 0.1 10*3/uL (ref 0.0–0.1)
Basophils Relative: 1 %
Eosinophils Absolute: 0.1 10*3/uL (ref 0.0–0.5)
Eosinophils Relative: 1 %
HCT: 46.8 % (ref 39.0–52.0)
Hemoglobin: 15.8 g/dL (ref 13.0–17.0)
Immature Granulocytes: 0 %
Lymphocytes Relative: 30 %
Lymphs Abs: 2.6 10*3/uL (ref 0.7–4.0)
MCH: 30.4 pg (ref 26.0–34.0)
MCHC: 33.8 g/dL (ref 30.0–36.0)
MCV: 90 fL (ref 80.0–100.0)
Monocytes Absolute: 1 10*3/uL (ref 0.1–1.0)
Monocytes Relative: 11 %
Neutro Abs: 4.9 10*3/uL (ref 1.7–7.7)
Neutrophils Relative %: 57 %
Platelets: 304 10*3/uL (ref 150–400)
RBC: 5.2 MIL/uL (ref 4.22–5.81)
RDW: 12.7 % (ref 11.5–15.5)
WBC: 8.7 10*3/uL (ref 4.0–10.5)
nRBC: 0 % (ref 0.0–0.2)

## 2019-05-03 LAB — TROPONIN I (HIGH SENSITIVITY): Troponin I (High Sensitivity): 8 ng/L (ref <18)

## 2019-05-03 LAB — CREATININE, SERUM
Creatinine, Ser: 1.05 mg/dL (ref 0.61–1.24)
GFR calc Af Amer: >60 mL/min (ref >60)
GFR calc non Af Amer: >60 mL/min (ref >60)

## 2019-05-03 LAB — ECHOCARDIOGRAM COMPLETE
Height: 75 in
Weight: 3714.31 oz

## 2019-05-03 LAB — POCT ACTIVATED CLOTTING TIME
Activated Clotting Time: 219 seconds
Activated Clotting Time: 367 seconds

## 2019-05-03 LAB — RESPIRATORY PANEL BY RT PCR (FLU A&B, COVID)
Influenza A by PCR: NEGATIVE
Influenza B by PCR: NEGATIVE
SARS Coronavirus 2 by RT PCR: NEGATIVE

## 2019-05-03 LAB — GLUCOSE, CAPILLARY: Glucose-Capillary: 91 mg/dL (ref 70–99)

## 2019-05-03 SURGERY — CORONARY/GRAFT ACUTE MI REVASCULARIZATION
Anesthesia: Moderate Sedation

## 2019-05-03 MED ORDER — ACETAMINOPHEN 325 MG PO TABS: 650.0000 mg | ORAL_TABLET | ORAL | Status: DC | PRN

## 2019-05-03 MED ORDER — ATORVASTATIN CALCIUM 20 MG PO TABS
80.0000 mg | ORAL_TABLET | Freq: Every day | ORAL | Status: DC
  Administered 2019-05-03: 80 mg via ORAL
  Filled 2019-05-03: qty 4

## 2019-05-03 MED ORDER — MIDAZOLAM HCL 2 MG/2ML IJ SOLN
INTRAMUSCULAR | Status: DC | PRN
  Administered 2019-05-03 (×2): 1 mg via INTRAVENOUS

## 2019-05-03 MED ORDER — HEPARIN SODIUM (PORCINE) 1000 UNIT/ML IJ SOLN
INTRAMUSCULAR | Status: DC | PRN
  Administered 2019-05-03: 7000 [IU] via INTRAVENOUS
  Administered 2019-05-03: 3000 [IU] via INTRAVENOUS

## 2019-05-03 MED ORDER — FENTANYL CITRATE (PF) 100 MCG/2ML IJ SOLN
INTRAMUSCULAR | Status: DC | PRN
  Administered 2019-05-03: 50 ug via INTRAVENOUS
  Administered 2019-05-03: 25 ug via INTRAVENOUS

## 2019-05-03 MED ORDER — PRASUGREL HCL 10 MG PO TABS
ORAL_TABLET | ORAL | Status: DC | PRN
  Administered 2019-05-03: 60 mg via ORAL

## 2019-05-03 MED ORDER — NITROGLYCERIN 0.4 MG SL SUBL
0.4000 mg | SUBLINGUAL_TABLET | SUBLINGUAL | Status: DC | PRN
  Administered 2019-05-03: 0.4 mg via SUBLINGUAL

## 2019-05-03 MED ORDER — HEPARIN (PORCINE) IN NACL 1000-0.9 UT/500ML-% IV SOLN
INTRAVENOUS | Status: DC | PRN
  Administered 2019-05-03: 1000 mL

## 2019-05-03 MED ORDER — ENOXAPARIN SODIUM 40 MG/0.4ML ~~LOC~~ SOLN
40.0000 mg | SUBCUTANEOUS | Status: DC
  Administered 2019-05-04 – 2019-05-05 (×2): 40 mg via SUBCUTANEOUS
  Filled 2019-05-03 (×2): qty 0.4

## 2019-05-03 MED ORDER — FENTANYL CITRATE (PF) 100 MCG/2ML IJ SOLN
INTRAMUSCULAR | Status: AC
  Filled 2019-05-03: qty 2

## 2019-05-03 MED ORDER — HEPARIN (PORCINE) IN NACL 1000-0.9 UT/500ML-% IV SOLN
INTRAVENOUS | Status: AC
  Filled 2019-05-03: qty 1000

## 2019-05-03 MED ORDER — HEPARIN BOLUS VIA INFUSION
4000.0000 [IU] | Freq: Once | INTRAVENOUS | Status: AC
  Administered 2019-05-03: 4000 [IU] via INTRAVENOUS
  Filled 2019-05-03: qty 4000

## 2019-05-03 MED ORDER — MIDAZOLAM HCL 2 MG/2ML IJ SOLN
INTRAMUSCULAR | Status: AC
  Filled 2019-05-03: qty 2

## 2019-05-03 MED ORDER — PRASUGREL HCL 10 MG PO TABS
10.0000 mg | ORAL_TABLET | Freq: Every day | ORAL | Status: DC
  Administered 2019-05-04 – 2019-05-05 (×2): 10 mg via ORAL
  Filled 2019-05-03 (×2): qty 1

## 2019-05-03 MED ORDER — SODIUM CHLORIDE 0.9% FLUSH
3.0000 mL | Freq: Two times a day (BID) | INTRAVENOUS | Status: DC
  Administered 2019-05-04 – 2019-05-05 (×2): 3 mL via INTRAVENOUS

## 2019-05-03 MED ORDER — HEPARIN SODIUM (PORCINE) 1000 UNIT/ML IJ SOLN
INTRAMUSCULAR | Status: AC
  Filled 2019-05-03: qty 1

## 2019-05-03 MED ORDER — ATROPINE SULFATE 1 MG/10ML IJ SOSY
PREFILLED_SYRINGE | INTRAMUSCULAR | Status: DC | PRN
  Administered 2019-05-03: 0.5 mg via INTRAVENOUS

## 2019-05-03 MED ORDER — VERAPAMIL HCL 2.5 MG/ML IV SOLN
INTRAVENOUS | Status: DC | PRN
  Administered 2019-05-03: 2.5 mg via INTRA_ARTERIAL

## 2019-05-03 MED ORDER — ONDANSETRON HCL 4 MG/2ML IJ SOLN: 4.0000 mg | Freq: Four times a day (QID) | INTRAMUSCULAR | Status: DC | PRN

## 2019-05-03 MED ORDER — NITROGLYCERIN 1 MG/10 ML FOR IR/CATH LAB
INTRA_ARTERIAL | Status: DC | PRN
  Administered 2019-05-03: 100 ug via INTRACORONARY

## 2019-05-03 MED ORDER — CARVEDILOL 3.125 MG PO TABS
3.1250 mg | ORAL_TABLET | Freq: Two times a day (BID) | ORAL | Status: DC
  Administered 2019-05-03: 3.125 mg via ORAL
  Filled 2019-05-03 (×2): qty 1

## 2019-05-03 MED ORDER — SODIUM CHLORIDE 0.9 % IV SOLN: INTRAVENOUS | Status: AC

## 2019-05-03 MED ORDER — ASPIRIN 81 MG PO CHEW
81.0000 mg | CHEWABLE_TABLET | Freq: Once | ORAL | Status: AC
  Administered 2019-05-03: 81 mg via ORAL

## 2019-05-03 MED ORDER — IOHEXOL 300 MG/ML  SOLN
INTRAMUSCULAR | Status: DC | PRN
  Administered 2019-05-03: 165 mL

## 2019-05-03 MED ORDER — SODIUM CHLORIDE 0.9% FLUSH: 3.0000 mL | INTRAVENOUS | Status: DC | PRN

## 2019-05-03 MED ORDER — PRASUGREL HCL 10 MG PO TABS
ORAL_TABLET | ORAL | Status: AC
  Filled 2019-05-03: qty 6

## 2019-05-03 MED ORDER — VERAPAMIL HCL 2.5 MG/ML IV SOLN
INTRAVENOUS | Status: AC
  Filled 2019-05-03: qty 2

## 2019-05-03 MED ORDER — ASPIRIN 81 MG PO CHEW
81.0000 mg | CHEWABLE_TABLET | Freq: Every day | ORAL | Status: DC
  Administered 2019-05-03 – 2019-05-05 (×3): 81 mg via ORAL
  Filled 2019-05-03 (×3): qty 1

## 2019-05-03 MED ORDER — ATROPINE SULFATE 1 MG/10ML IJ SOSY
PREFILLED_SYRINGE | INTRAMUSCULAR | Status: AC
  Filled 2019-05-03: qty 10

## 2019-05-03 MED ORDER — NITROGLYCERIN 1 MG/10 ML FOR IR/CATH LAB
INTRA_ARTERIAL | Status: AC
  Filled 2019-05-03: qty 10

## 2019-05-03 MED ORDER — SODIUM CHLORIDE 0.9 % IV SOLN: 250.0000 mL | INTRAVENOUS | Status: DC | PRN

## 2019-05-03 SURGICAL SUPPLY — 23 items
BALLN EUPHORA RX 2.0X12 (BALLOONS) ×2
BALLN ~~LOC~~ TREK RX 3.25X12 (BALLOONS) ×2
BALLOON EUPHORA RX 2.0X12 (BALLOONS) ×1 IMPLANT
BALLOON ~~LOC~~ TREK RX 3.25X12 (BALLOONS) ×1 IMPLANT
CATH INFINITI 5 FR JL3.5 (CATHETERS) ×2 IMPLANT
CATH INFINITI 5FR ANG PIGTAIL (CATHETERS) ×2 IMPLANT
CATH INFINITI JR4 5F (CATHETERS) ×2 IMPLANT
CATH LAUNCHER 6FR EBU3.5 (CATHETERS) ×2 IMPLANT
CATH VISTA GUIDE 6FR JR4 (CATHETERS) ×2 IMPLANT
DEVICE INFLAT 30 PLUS (MISCELLANEOUS) ×2 IMPLANT
DEVICE RAD TR BAND REGULAR (VASCULAR PRODUCTS) ×2 IMPLANT
GLIDESHEATH SLEND SS 6F .021 (SHEATH) ×2 IMPLANT
KIT MANI 3VAL PERCEP (MISCELLANEOUS) ×2 IMPLANT
NEEDLE PERC 18GX7CM (NEEDLE) ×2 IMPLANT
PACK CARDIAC CATH (CUSTOM PROCEDURE TRAY) ×2 IMPLANT
SHEATH AVANTI 5FR X 11CM (SHEATH) ×2 IMPLANT
SHEATH AVANTI 7FRX11 (SHEATH) ×2 IMPLANT
STENT RESOLUTE ONYX 2.5X15 (Permanent Stent) ×2 IMPLANT
STENT RESOLUTE ONYX 3.0X15 (Permanent Stent) ×2 IMPLANT
WIRE GUIDERIGHT .035X150 (WIRE) ×2 IMPLANT
WIRE HI TORQ WHISPER MS 190CM (WIRE) ×2 IMPLANT
WIRE ROSEN-J .035X260CM (WIRE) ×2 IMPLANT
WIRE RUNTHROUGH .014X180CM (WIRE) ×4 IMPLANT

## 2019-05-03 NOTE — ED Provider Notes (Signed)
[facility] Emergency Department Provider Note   ____________________________________________   First MD Initiated Contact with Patient 05/03/19 1233     (approximate)  I have reviewed the triage vital signs and the nursing notes.   HISTORY  Chief Complaint Chest Pain    HPI Jackson Williamson is a 64 y.o. male with possible history of hyperlipidemia and hypothyroidism who presents to the ED complaining of chest pain.  Patient reports he had acute onset of central chest pressure about 30 minutes prior to arrival while sitting on the couch.  It seemed to radiate into both sides of his jaw and has been present constantly since onset, not exacerbated or alleviated by anything.  He denies any nausea or shortness of breath, has recently been feeling well with no fevers or cough.  He endorses a strong family history of heart disease, with his brother having an MI 2 years ago, but he denies any personal cardiac history.        Past Medical History:  Diagnosis Date  . ED (erectile dysfunction)   . GERD (gastroesophageal reflux disease)   . History of chicken pox   . Hyperlipidemia   . Hypothyroidism     Patient Active Problem List   Diagnosis Date Noted  . ST elevation myocardial infarction (STEMI) of inferior wall (HCC) 05/03/2019  . Hearing loss 09/16/2014  . ED (erectile dysfunction)   . Drooping eyelid 08/05/2014  . Umbilical hernia 08/20/2012  . Hydrocele 08/20/2012  . Healthcare maintenance 07/30/2012  . Systolic murmur 07/30/2012  . Hypothyroidism   . GERD (gastroesophageal reflux disease)   . Hyperlipidemia 03/18/2012    Past Surgical History:  Procedure Laterality Date  . CORONARY/GRAFT ACUTE MI REVASCULARIZATION N/A 05/03/2019   Procedure: Coronary/Graft Acute MI Revascularization;  Surgeon: Iran Ouch, MD;  Location: [facility] INVASIVE CV LAB;  Service: Cardiovascular;  Laterality: N/A;  . DOBUTAMINE STRESS ECHO  2013   normal per patient    . HERNIA REPAIR Right 05/2012   RIH and umbilical  . KNEE SURGERY  [phone]   left knee  . LEFT HEART CATH AND CORONARY ANGIOGRAPHY N/A 05/03/2019   Procedure: LEFT HEART CATH AND CORONARY ANGIOGRAPHY;  Surgeon: Iran Ouch, MD;  Location: [facility] INVASIVE CV LAB;  Service: Cardiovascular;  Laterality: N/A;    Prior to Admission medications   Medication Sig Start Date End Date Taking? Authorizing Provider  aspirin 81 MG tablet Take 81 mg by mouth daily.     [provider]  pravastatin (PRAVACHOL) 40 MG tablet Take 1 tablet (40 mg total) by mouth daily. 08/05/14   Eustaquio Boyden, MD  sildenafil (REVATIO) 20 MG tablet TAKE 2 TO 5 TABLETS BY MOUTH AS NEEDED 03/01/15   Eustaquio Boyden, MD  sildenafil (VIAGRA) 100 MG tablet Take 1 tablet (100 mg total) by mouth daily as needed for erectile dysfunction. 09/16/14   Eustaquio Boyden, MD  SYNTHROID 100 MCG tablet TAKE 1 TABLET BY MOUTH EVERY DAY BEFORE BREAKFAST 06/15/15   Eustaquio Boyden, MD    Allergies Levothyroxine  Family History  Problem Relation Age of Onset  . CAD Mother 34       s/p stent  . Hyperlipidemia Mother   . CAD Father 2       s/p CABG x 4  . Hyperlipidemia Father   . Hypertension Father   . Hyperlipidemia Brother   . CAD Brother 52       stent  . Cancer Maternal Aunt 70  colon  . Cancer Maternal Uncle 54       prostate  . Cancer Cousin 40       prostate  . Cancer Maternal Uncle 21       kidney    Social History Social History   Tobacco Use  . Smoking status: Never Smoker  . Smokeless tobacco: Former Neurosurgeon    Types: Snuff  . Tobacco comment: social as youth  Substance Use Topics  . Alcohol use: No  . Drug use: No    Review of Systems  Constitutional: No fever/chills Eyes: No visual changes. ENT: No sore throat. Cardiovascular: Positive for chest pain. Respiratory: Denies shortness of breath. Gastrointestinal: No abdominal pain.  No nausea, no vomiting.  No diarrhea.  No  constipation. Genitourinary: Negative for dysuria. Musculoskeletal: Negative for back pain. Skin: Negative for rash. Neurological: Negative for headaches, focal weakness or numbness.  ____________________________________________   PHYSICAL EXAM:  VITAL SIGNS: ED Triage Vitals  Enc Vitals Group     BP 05/03/19 1222 (!) 154/95     Pulse Rate 05/03/19 1222 (!) 55     Resp 05/03/19 1222 20     Temp 05/03/19 1222 98 F (36.7 C)     Temp Source 05/03/19 1222 Oral     SpO2 05/03/19 1222 100 %     Weight 05/03/19 1221 230 lb (104.3 kg)     Height 05/03/19 1221 6\' 3"  (1.905 m)     Head Circumference --      Peak Flow --      Pain Score 05/03/19 1221 5     Pain Loc --      Pain Edu? --      Excl. in GC? --     Constitutional: Alert and oriented. Eyes: Conjunctivae are normal. Head: Atraumatic. Nose: No congestion/rhinnorhea. Mouth/Throat: Mucous membranes are moist. Neck: Normal ROM Cardiovascular: Normal rate, regular rhythm. Grossly normal heart sounds.  2+ radial and DP pulses bilaterally. Respiratory: Normal respiratory effort.  No retractions. Lungs CTAB.  No chest wall tenderness to palpation. Gastrointestinal: Soft and nontender. No distention. Genitourinary: deferred Musculoskeletal: No lower extremity tenderness nor edema. Neurologic:  Normal speech and language. No gross focal neurologic deficits are appreciated. Skin:  Skin is warm, dry and intact. No rash noted. Psychiatric: Mood and affect are normal. Speech and behavior are normal.  ____________________________________________   LABS (all labs ordered are listed, but only abnormal results are displayed)  Labs Reviewed  BASIC METABOLIC PANEL - Abnormal; Notable for the following components:      Result Value   Glucose, Bld 108 (*)    BUN 24 (*)    Creatinine, Ser 1.26 (*)    All other components within normal limits  RESPIRATORY PANEL BY RT PCR (FLU A&B, COVID)  CBC WITH DIFFERENTIAL/PLATELET  CBC    CREATININE, SERUM  GLUCOSE, CAPILLARY  POCT ACTIVATED CLOTTING TIME  POCT ACTIVATED CLOTTING TIME  TROPONIN I (HIGH SENSITIVITY)   ____________________________________________  EKG  ED ECG REPORT I, 07/03/19, the attending physician, personally viewed and interpreted this ECG.   Date: 05/03/2019  EKG Time: 12:22  Rate: 64  Rhythm: normal sinus rhythm  Axis: Normal  Intervals:none  ST&T Change: ST elevations inferiorly with reciprocal depressions laterally  ED ECG REPORT I, 07/03/2019, the attending physician, personally viewed and interpreted this ECG.   Date: 05/03/2019  EKG Time: 12:36  Rate: 67  Rhythm: normal sinus rhythm  Axis: Normal  Intervals:none  ST&T Change: Improving ST  elevation inferiorly with lateral reciprocal depressions    PROCEDURES  Procedure(s) performed (including Critical Care):  .Critical Care Performed by: Blake Divine, MD Authorized by: Blake Divine, MD   Critical care provider statement:    Critical care time (minutes):  20   Critical care time was exclusive of:  Separately billable procedures and treating other patients and teaching time   Critical care was necessary to treat or prevent imminent or life-threatening deterioration of the following conditions:  Cardiac failure   Critical care was time spent personally by me on the following activities:  Discussions with consultants, evaluation of patient's response to treatment, examination of patient, ordering and performing treatments and interventions, ordering and review of laboratory studies, ordering and review of radiographic studies, pulse oximetry, re-evaluation of patient's condition, obtaining history from patient or surrogate and review of old charts   I assumed direction of critical care for this patient from another provider in my specialty: no       ____________________________________________   INITIAL IMPRESSION / ASSESSMENT AND PLAN / ED COURSE        64 year old male presents to the ED complaining of acute onset of chest pressure radiating up into both sides of his jaw while he was at rest approximately 30 minutes prior to arrival.  Initial EKG concerning for inferior STEMI and code STEMI was called.  Patient's pain improved once he arrived to a room and repeat EKG does show improvement.  He was given loading dose of aspirin, cardiology evaluated patient at the bedside and he was taken immediately for cardiac catheterization.      ____________________________________________   FINAL CLINICAL IMPRESSION(S) / ED DIAGNOSES  Final diagnoses:  ST elevation myocardial infarction (STEMI), unspecified artery (HCC)  Chest pain, unspecified type     ED Discharge Orders         Ordered    AMB Referral to Cardiac Rehabilitation - Phase II     05/03/19 1406           Note:  This document was prepared using Dragon voice recognition software and may include unintentional dictation errors.   Blake Divine, MD 05/03/19 [phone]

## 2019-05-03 NOTE — ED Notes (Signed)
MD at bedside. CPR cart at bedside. Zole pads placed on pt.

## 2019-05-03 NOTE — ED Notes (Signed)
Repeat EKG done.

## 2019-05-03 NOTE — Progress Notes (Signed)
   05/03/19 1229  Clinical Encounter Type  Visited With Patient and family together  Visit Type Initial  Referral From Patient  Consult/Referral To Chaplain  Spiritual Encounters  Spiritual Needs Other (Comment)  [facility] received page for stemi response in ED rm#5. Patient was being prepared for cath lab upon arrival. [facility] visited pt when patient was in ICU rm#7. Pt had daughter present. Has strong emotional support system. Declined pastoral care. No further actions needed at this time.

## 2019-05-03 NOTE — Progress Notes (Signed)
*  PRELIMINARY RESULTS* Echocardiogram 2D Echocardiogram has been performed.  Jackson Williamson 05/03/2019, 7:08 PM

## 2019-05-03 NOTE — ED Notes (Addendum)
Transported to cath lab 

## 2019-05-03 NOTE — ED Triage Notes (Signed)
Pt c/o substernal chest tightness with Bl jaw pain that started about 20-34min PTA while sitting on the couch, pt is pale and clammy on arrival.

## 2019-05-03 NOTE — H&P (Signed)
Cardiology Admission History and Physical:   Patient ID: Jackson Williamson MRN: [id number]; DOB: 1955-04-05   Admission date: 05/03/2019  Primary Care Provider: Eustaquio Boyden, MD Primary Cardiologist:  New ( seen one time in 2014 by Dr. Mariah Milling) Primary Electrophysiologist:  None   Chief Complaint: Chest pain.  Patient Profile:   Jackson MONTAGUE is a 64 y.o. male with past medical history of severe hyperlipidemia not on treatment and family history of coronary artery disease who presented with chest pain and abnormal EKG.  History of Present Illness:   Jackson Williamson is a 64 year old male with no previous cardiac history.  He was seen in 2014 by Dr. Lewie Loron for atypical chest and jaw pain with previous negative cardiac work-up at [facility].  The patient has known history of severe hyperlipidemia and at some point in the past he was on lovastatin but stopped the medication.  Also he has history of hypothyroidism but has not been on any medication over the last few years.  He actually has not seen a primary care physician in more than 3 years.  He is not a smoker but has strong family history of premature coronary artery disease.  He presented to the ED with chest pain.  The pain started 30 minutes prior to arrival.  It was substernal and severe in intensity.  Described as heaviness and tightness feeling as if something was sitting in his chest.  It was associated with shortness of breath.  He never experienced any previous similar episodes.  EKG upon arrival to the ED showed sinus rhythm with minor inferior ST elevation with reciprocal ST depression in lead I and aVL.  Based on these changes in his symptoms, code STEMI was called.  The patient was given sublingual nitroglycerin and unfractionated heparin.  He continued to have chest pain and I have recommended proceeding with emergent cardiac catheterization and possible PCI.  Heart Pathway Score:     Past Medical History:  Diagnosis Date  . ED (erectile  dysfunction)   . GERD (gastroesophageal reflux disease)   . History of chicken pox   . Hyperlipidemia   . Hypothyroidism     Past Surgical History:  Procedure Laterality Date  . CORONARY/GRAFT ACUTE MI REVASCULARIZATION N/A 05/03/2019   Procedure: Coronary/Graft Acute MI Revascularization;  Surgeon: Iran Ouch, MD;  Location: [facility] INVASIVE CV LAB;  Service: Cardiovascular;  Laterality: N/A;  . DOBUTAMINE STRESS ECHO  2013   normal per patient  . HERNIA REPAIR Right 05/2012   RIH and umbilical  . KNEE SURGERY  [phone]   left knee  . LEFT HEART CATH AND CORONARY ANGIOGRAPHY N/A 05/03/2019   Procedure: LEFT HEART CATH AND CORONARY ANGIOGRAPHY;  Surgeon: Iran Ouch, MD;  Location: [facility] INVASIVE CV LAB;  Service: Cardiovascular;  Laterality: N/A;     Medications Prior to Admission: Prior to Admission medications   Medication Sig Start Date End Date Taking? Authorizing Provider  aspirin 81 MG tablet Take 81 mg by mouth daily.     [provider]  pravastatin (PRAVACHOL) 40 MG tablet Take 1 tablet (40 mg total) by mouth daily. 08/05/14   Eustaquio Boyden, MD  sildenafil (REVATIO) 20 MG tablet TAKE 2 TO 5 TABLETS BY MOUTH AS NEEDED 03/01/15   Eustaquio Boyden, MD  sildenafil (VIAGRA) 100 MG tablet Take 1 tablet (100 mg total) by mouth daily as needed for erectile dysfunction. 09/16/14   Eustaquio Boyden, MD  SYNTHROID 100 MCG tablet TAKE 1 TABLET BY MOUTH  EVERY DAY BEFORE BREAKFAST 06/15/15   Eustaquio Boyden, MD     Allergies:    Allergies  Allergen Reactions  . Levothyroxine Other (See Comments)    Severe GERD    Social History:   Social History   Socioeconomic History  . Marital status: Single    Spouse name: Not on file  . Number of children: Not on file  . Years of education: Not on file  . Highest education level: Not on file  Occupational History  . Not on file  Tobacco Use  . Smoking status: Never Smoker  . Smokeless tobacco: Former Neurosurgeon     Types: Snuff  . Tobacco comment: social as youth  Substance and Sexual Activity  . Alcohol use: No  . Drug use: No  . Sexual activity: Not on file  Other Topics Concern  . Not on file  Social History Narrative   Divorced, lives with daughter, 1 dog (Advertising account planner)   Occupation: Museum/gallery curator   Edu: BS   Activity: runs, bikes, swims, cross fit 5-6 d/wk   Diet: good water, fruits/vegetables daily, "80% paleo"   Social Determinants of Health   Financial Resource Strain:   . Difficulty of Paying Living Expenses: Not on file  Food Insecurity:   . Worried About Programme researcher, broadcasting/film/video in the Last Year: Not on file  . Ran Out of Food in the Last Year: Not on file  Transportation Needs:   . Lack of Transportation (Medical): Not on file  . Lack of Transportation (Non-Medical): Not on file  Physical Activity:   . Days of Exercise per Week: Not on file  . Minutes of Exercise per Session: Not on file  Stress:   . Feeling of Stress : Not on file  Social Connections:   . Frequency of Communication with Friends and Family: Not on file  . Frequency of Social Gatherings with Friends and Family: Not on file  . Attends Religious Services: Not on file  . Active Member of Clubs or Organizations: Not on file  . Attends Banker Meetings: Not on file  . Marital Status: Not on file  Intimate Partner Violence:   . Fear of Current or Ex-Partner: Not on file  . Emotionally Abused: Not on file  . Physically Abused: Not on file  . Sexually Abused: Not on file    Family History:   The patient's family history includes CAD (age of onset: 79) in his brother; CAD (age of onset: 62) in his mother; CAD (age of onset: 70) in his father; Cancer (age of onset: 51) in his cousin; Cancer (age of onset: 39) in his maternal uncle; Cancer (age of onset: 62) in his maternal aunt; Cancer (age of onset: 28) in his maternal uncle; Hyperlipidemia in his brother, father, and mother; Hypertension in his  father.    ROS:  Please see the history of present illness.  All other ROS reviewed and negative.     Physical Exam/Data:   Vitals:   05/03/19 1232 05/03/19 1239 05/03/19 1241 05/03/19 1423  BP:  (!) 169/107  130/90  Pulse: 60 (!) 59 76 61  Resp:  12  14  Temp:  98.3 F (36.8 C)  97.6 F (36.4 C)  TempSrc:    Oral  SpO2:  100%  98%  Weight:    105.3 kg  Height:    6\' 3"  (1.905 m)   No intake or output data in the 24 hours ending 05/03/19 1523  Last 3 Weights 05/03/2019 05/03/2019 09/16/2014  Weight (lbs) 232 lb 2.3 oz 230 lb 224 lb 8 oz  Weight (kg) 105.3 kg 104.327 kg 101.833 kg     Body mass index is 29.02 kg/m.  General:  Well nourished, well developed, in no acute distress HEENT: normal Lymph: no adenopathy Neck: no JVD Endocrine:  No thryomegaly Vascular: No carotid bruits; FA pulses 2+ bilaterally without bruits  Cardiac:  normal S1, S2; RRR; no murmur  Lungs:  clear to auscultation bilaterally, no wheezing, rhonchi or rales  Abd: soft, nontender, no hepatomegaly  Ext: no edema Musculoskeletal:  No deformities, BUE and BLE strength normal and equal Skin: warm and dry  Neuro:  CNs 2-12 intact, no focal abnormalities noted Psych:  Normal affect    EKG:  The ECG that was done it was personally reviewed and demonstrates normal sinus rhythm with 1 mm elevation.  With reciprocal ST depression in 1 and aVL.  Relevant CV Studies: Cardiac catheterization was done by me:  1.  Significant underlying three-vessel coronary artery disease.  The culprit is an occluded distal right coronary artery with significant stenosis in the proximal vessel as well.  There is chronic subtotal occlusion of OM1 and borderline significant disease in the mid left circumflex, mid LAD and distal LAD. 2.  Mildly elevated left ventricular end-diastolic pressure.  Left ventricular angiography was not performed. 3.  Successful angioplasty and drug-eluting stent placement to the distal as well as  proximal right coronary artery.  The OM1 subtotal occlusion was probed with 2 wires and was not able to cross indicating chronicity.  Recommendations: Dual antiplatelet therapy for at least 1 year. Obtain an echocardiogram. Aggressive treatment of risk factors. Left coronary artery disease can likely be treated with aggressive medical therapy.  FFR guided revascularization can be considered if the patient has residual anginal symptoms.    Laboratory Data:  High Sensitivity Troponin:   Recent Labs  Lab 05/03/19 1230  TROPONINIHS 8      Chemistry Recent Labs  Lab 05/03/19 1230 05/03/19 1437  NA 139  --   K 3.8  --   CL 106  --   CO2 26  --   GLUCOSE 108*  --   BUN 24*  --   CREATININE 1.26* 1.05  CALCIUM 9.6  --   GFRNONAA >60 >60  GFRAA >60 >60  ANIONGAP 7  --     No results for input(s): PROT, ALBUMIN, AST, ALT, ALKPHOS, BILITOT in the last 168 hours. Hematology Recent Labs  Lab 05/03/19 1230 05/03/19 1437  WBC 8.7 9.4  RBC 5.20 4.98  HGB 15.8 15.0  HCT 46.8 44.7  MCV 90.0 89.8  MCH 30.4 30.1  MCHC 33.8 33.6  RDW 12.7 12.7  PLT 304 276   BNPNo results for input(s): BNP, PROBNP in the last 168 hours.  DDimer No results for input(s): DDIMER in the last 168 hours.   Radiology/Studies:  CARDIAC CATHETERIZATION  Result Date: 05/03/2019  Mid Cx lesion is 60% stenosed.  1st Mrg lesion is 99% stenosed.  Dist LAD lesion is 90% stenosed.  Mid LAD lesion is 70% stenosed.  Prox LAD to Mid LAD lesion is 40% stenosed.  2nd Diag lesion is 50% stenosed.  1st Diag lesion is 60% stenosed.  Dist RCA lesion is 100% stenosed.  Post intervention, there is a 0% residual stenosis.  A drug-eluting stent was successfully placed using a STENT RESOLUTE ONYX 2.5X15.  Prox RCA lesion is 70% stenosed.  Post intervention, there is a 0% residual stenosis.  A drug-eluting stent was successfully placed using a STENT RESOLUTE ONYX 3.0X15.  RPDA lesion is 100% stenosed.  1.   Significant underlying three-vessel coronary artery disease.  The culprit is an occluded distal right coronary artery with significant stenosis in the proximal vessel as well.  There is chronic subtotal occlusion of OM1 and borderline significant disease in the mid left circumflex, mid LAD and distal LAD. 2.  Mildly elevated left ventricular end-diastolic pressure.  Left ventricular angiography was not performed. 3.  Successful angioplasty and drug-eluting stent placement to the distal as well as proximal right coronary artery.  The OM1 subtotal occlusion was probed with 2 wires and was not able to cross indicating chronicity. Recommendations: Dual antiplatelet therapy for at least 1 year. Obtain an echocardiogram. Aggressive treatment of risk factors. Left coronary artery disease can likely be treated with aggressive medical therapy.  FFR guided revascularization can be considered if the patient has residual anginal symptoms.     TIMI Risk Score for ST  Elevation MI:   The patient's TIMI risk score is 0, which indicates a 0.8% risk of all cause mortality at 30 days.    Assessment and Plan:   1. Acute inferior ST elevation myocardial infarction: The patient was given aspirin and unfractionated heparin.  Emergent cardiac catheterization was done by me via the right radial artery which showed significant underlying three-vessel coronary artery disease.  The culprit was an occluded distal right coronary arteries with significant stenosis in the proximal RCA.  In addition, there was chronic subtotal occlusion of OM1 and borderline significant disease in the mid LAD and mid left circumflex.  Also there was significant distal LAD stenosis close to the apex.  LVEDP was mildly elevated.  I performed successful angioplasty and drug-eluting stent placement to the distal RCA and proximal RCA.  OM1 subtotal occlusion was probed with 2 wires and was not able to cross indicating that this is a chronic occlusion.   Recommend dual antiplatelet therapy for at least 1 year.  I requested an echocardiogram.  The patient needs aggressive treatment of risk factors and cardiac rehab.  The rest of his coronary artery disease can likely be treated with aggressive medical therapy.  FFR guided revascularization can be considered if the patient has residual anginal symptoms. 2. Hyperlipidemia: I started high-dose atorvastatin.  I requested lipid profile.  His lipid profile should be repeated again in 2 months and if his LDL remains above 70, I recommend adding a PCSK9 inhibitor given that his LDL in the past was very high close to 180. 3. History of hypothyroidism: Currently not on any treatment and he has not seen a primary care physician in few years.  I requested TSH.  Severity of Illness: The appropriate patient status for this patient is INPATIENT. Inpatient status is judged to be reasonable and necessary in order to provide the required intensity of service to ensure the patient's safety. The patient's presenting symptoms, physical exam findings, and initial radiographic and laboratory data in the context of their chronic comorbidities is felt to place them at high risk for further clinical deterioration. Furthermore, it is not anticipated that the patient will be medically stable for discharge from the hospital within 2 midnights of admission. The following factors support the patient status of inpatient.   " The patient's presenting symptoms include severe chest pain and EKG changes consistent with inferior ST elevation myocardial infarction..   * I certify that  at the point of admission it is my clinical judgment that the patient will require inpatient hospital care spanning beyond 2 midnights from the point of admission due to high intensity of service, high risk for further deterioration and high frequency of surveillance required.*    For questions or updates, please contact [facility] Please consult  [email or web address] for contact info under        Signed, Lorine Bears, MD  05/03/2019 3:23 PM

## 2019-05-03 NOTE — ED Notes (Signed)
Cardiologist at bedside.  

## 2019-05-04 ENCOUNTER — Encounter: Payer: Self-pay | Admitting: Family Medicine

## 2019-05-04 DIAGNOSIS — R079 Chest pain, unspecified: Secondary | ICD-10-CM

## 2019-05-04 DIAGNOSIS — E039 Hypothyroidism, unspecified: Secondary | ICD-10-CM

## 2019-05-04 DIAGNOSIS — E782 Mixed hyperlipidemia: Secondary | ICD-10-CM

## 2019-05-04 DIAGNOSIS — I2510 Atherosclerotic heart disease of native coronary artery without angina pectoris: Secondary | ICD-10-CM | POA: Insufficient documentation

## 2019-05-04 DIAGNOSIS — I2119 ST elevation (STEMI) myocardial infarction involving other coronary artery of inferior wall: Principal | ICD-10-CM

## 2019-05-04 LAB — CBC
HCT: 41.9 % (ref 39.0–52.0)
Hemoglobin: 14 g/dL (ref 13.0–17.0)
MCH: 30 pg (ref 26.0–34.0)
MCHC: 33.4 g/dL (ref 30.0–36.0)
MCV: 89.9 fL (ref 80.0–100.0)
Platelets: 276 10*3/uL (ref 150–400)
RBC: 4.66 MIL/uL (ref 4.22–5.81)
RDW: 12.7 % (ref 11.5–15.5)
WBC: 11.1 10*3/uL — ABNORMAL HIGH (ref 4.0–10.5)
nRBC: 0 % (ref 0.0–0.2)

## 2019-05-04 LAB — BASIC METABOLIC PANEL
Anion gap: 6 (ref 5–15)
BUN: 18 mg/dL (ref 8–23)
CO2: 25 mmol/L (ref 22–32)
Calcium: 8.6 mg/dL — ABNORMAL LOW (ref 8.9–10.3)
Chloride: 109 mmol/L (ref 98–111)
Creatinine, Ser: 1.19 mg/dL (ref 0.61–1.24)
GFR calc Af Amer: >60 mL/min (ref >60)
GFR calc non Af Amer: >60 mL/min (ref >60)
Glucose, Bld: 103 mg/dL — ABNORMAL HIGH (ref 70–99)
Potassium: 4 mmol/L (ref 3.5–5.1)
Sodium: 140 mmol/L (ref 135–145)

## 2019-05-04 LAB — LIPID PANEL
Cholesterol: 238 mg/dL — ABNORMAL HIGH (ref 0–200)
HDL: 38 mg/dL — ABNORMAL LOW (ref >40)
LDL Cholesterol: 175 mg/dL — ABNORMAL HIGH (ref 0–99)
Total CHOL/HDL Ratio: 6.3 RATIO
Triglycerides: 127 mg/dL (ref <150)
VLDL: 25 mg/dL (ref 0–40)

## 2019-05-04 LAB — HEPATIC FUNCTION PANEL
ALT: 30 U/L (ref 0–44)
AST: 94 U/L — ABNORMAL HIGH (ref 15–41)
Albumin: 3.6 g/dL (ref 3.5–5.0)
Alkaline Phosphatase: 46 U/L (ref 38–126)
Bilirubin, Direct: <0.1 mg/dL (ref 0.0–0.2)
Total Bilirubin: 0.9 mg/dL (ref 0.3–1.2)
Total Protein: 6.2 g/dL — ABNORMAL LOW (ref 6.5–8.1)

## 2019-05-04 LAB — TROPONIN I (HIGH SENSITIVITY): Troponin I (High Sensitivity): >27000 ng/L (ref <18)

## 2019-05-04 LAB — TSH: TSH: 12.797 u[IU]/mL — ABNORMAL HIGH (ref 0.350–4.500)

## 2019-05-04 MED ORDER — LEVOTHYROXINE SODIUM 50 MCG PO TABS
50.0000 ug | ORAL_TABLET | Freq: Every day | ORAL | Status: DC
  Administered 2019-05-05: 50 ug via ORAL
  Filled 2019-05-04: qty 1

## 2019-05-04 MED ORDER — ROSUVASTATIN CALCIUM 10 MG PO TABS
40.0000 mg | ORAL_TABLET | Freq: Every day | ORAL | Status: DC
  Administered 2019-05-04: 40 mg via ORAL
  Filled 2019-05-04: qty 4

## 2019-05-04 MED ORDER — CHLORHEXIDINE GLUCONATE CLOTH 2 % EX PADS: 6.0000 | MEDICATED_PAD | Freq: Every day | CUTANEOUS | Status: DC

## 2019-05-04 NOTE — Progress Notes (Signed)
Progress Note  Patient Name: Jackson Williamson Date of Encounter: 05/04/2019  Primary Cardiologist: [facility]  Subjective   No significant shortness of breath or chest pain Long discussion concerning cardiac catheterization findings, Reports the chest tightness has resolved, feels better now than he has in quite some time -Previous anginal equivalent was shortness of breath walking up a hill when he plays golf, some chest tightness also fatigue with exertion.  Seems to have resolved but he has not exerted very much today  Inpatient Medications    Scheduled Meds: . aspirin  81 mg Oral Daily  . Chlorhexidine Gluconate Cloth  6 each Topical Daily  . enoxaparin (LOVENOX) injection  40 mg Subcutaneous Q24H  . [START ON 05/05/2019] levothyroxine  50 mcg Oral Q0600  . prasugrel  10 mg Oral Daily  . rosuvastatin  40 mg Oral q1800  . sodium chloride flush  3 mL Intravenous Q12H   Continuous Infusions: . sodium chloride     PRN Meds: sodium chloride, acetaminophen, ondansetron (ZOFRAN) IV, sodium chloride flush   Vital Signs    Vitals:   05/04/19 1300 05/04/19 1400 05/04/19 1500 05/04/19 1615  BP: (!) 90/58 99/70 98/61  104/73  Pulse: (!) 50 (!) 56 (!) 54 (!) 55  Resp: 14 12 16 18   Temp: 97.6 F (36.4 C)   97.9 F (36.6 C)  TempSrc: Oral     SpO2: 94% 95% 97% 98%  Weight:    104.9 kg  Height:    6\' 3"  (1.905 m)    Intake/Output Summary (Last 24 hours) at 05/04/2019 1736 Last data filed at 05/04/2019 1400 Gross per 24 hour  Intake 299.86 ml  Output 1920 ml  Net -1620.14 ml   Last 3 Weights 05/04/2019 05/03/2019 05/03/2019  Weight (lbs) 231 lb 4.8 oz 232 lb 2.3 oz 230 lb  Weight (kg) 104.917 kg 105.3 kg 104.327 kg      Telemetry    Normal sinus rhythm- Personally Reviewed  ECG     - Personally Reviewed  Physical Exam   GEN: No acute distress.   Neck: No JVD Cardiac: RRR, no murmurs, rubs, or gallops.  Respiratory: Clear to auscultation bilaterally. GI: Soft, nontender,  non-distended  MS: No edema; No deformity. Neuro:  Nonfocal  Psych: Normal affect   Labs    High Sensitivity Troponin:   Recent Labs  Lab 05/03/19 1230 05/04/19 0426  TROPONINIHS 8 >27,000*      Chemistry Recent Labs  Lab 05/03/19 1230 05/03/19 1437 05/04/19 0426  NA 139  --  140  K 3.8  --  4.0  CL 106  --  109  CO2 26  --  25  GLUCOSE 108*  --  103*  BUN 24*  --  18  CREATININE 1.26* 1.05 1.19  CALCIUM 9.6  --  8.6*  PROT  --   --  6.2*  ALBUMIN  --   --  3.6  AST  --   --  94*  ALT  --   --  30  ALKPHOS  --   --  46  BILITOT  --   --  0.9  GFRNONAA >60 >60 >60  GFRAA >60 >60 >60  ANIONGAP 7  --  6     Hematology Recent Labs  Lab 05/03/19 1230 05/03/19 1437 05/04/19 0426  WBC 8.7 9.4 11.1*  RBC 5.20 4.98 4.66  HGB 15.8 15.0 14.0  HCT 46.8 44.7 41.9  MCV 90.0 89.8 89.9  MCH 30.4 30.1  30.0  MCHC 33.8 33.6 33.4  RDW 12.7 12.7 12.7  PLT 304 276 276    BNPNo results for input(s): BNP, PROBNP in the last 168 hours.   DDimer No results for input(s): DDIMER in the last 168 hours.   Radiology    CARDIAC CATHETERIZATION  Result Date: 05/03/2019  Mid Cx lesion is 60% stenosed.  1st Mrg lesion is 99% stenosed.  Dist LAD lesion is 90% stenosed.  Mid LAD lesion is 70% stenosed.  Prox LAD to Mid LAD lesion is 40% stenosed.  2nd Diag lesion is 50% stenosed.  1st Diag lesion is 60% stenosed.  Dist RCA lesion is 100% stenosed.  Post intervention, there is a 0% residual stenosis.  A drug-eluting stent was successfully placed using a STENT RESOLUTE ONYX 2.5X15.  Prox RCA lesion is 70% stenosed.  Post intervention, there is a 0% residual stenosis.  A drug-eluting stent was successfully placed using a STENT RESOLUTE ONYX 3.0X15.  RPDA lesion is 100% stenosed.  1.  Significant underlying three-vessel coronary artery disease.  The culprit is an occluded distal right coronary artery with significant stenosis in the proximal vessel as well.  There is chronic  subtotal occlusion of OM1 and borderline significant disease in the mid left circumflex, mid LAD and distal LAD. 2.  Mildly elevated left ventricular end-diastolic pressure.  Left ventricular angiography was not performed. 3.  Successful angioplasty and drug-eluting stent placement to the distal as well as proximal right coronary artery.  The OM1 subtotal occlusion was probed with 2 wires and was not able to cross indicating chronicity. Recommendations: Dual antiplatelet therapy for at least 1 year. Obtain an echocardiogram. Aggressive treatment of risk factors. Left coronary artery disease can likely be treated with aggressive medical therapy.  FFR guided revascularization can be considered if the patient has residual anginal symptoms.    ECHOCARDIOGRAM COMPLETE  Result Date: 05/03/2019    ECHOCARDIOGRAM REPORT   Patient Name:   Jackson Williamson Date of Exam: 05/03/2019 Medical Rec #:  [id number]     Height:       75.0 in Accession #:    [id number]    Weight:       232.1 lb Date of Birth:  Sep 07, 1955     BSA:          2.338 m Patient Age:    64 years      BP:           152/92 mmHg Patient Gender: M             HR:           59 bpm. Exam Location:  [facility] Procedure: 2D Echo, Cardiac Doppler and Color Doppler Indications:     Acute MI 410  History:         Patient has no prior history of Echocardiogram examinations.                  Hyperlipidemia.  Sonographer:     Alyse Low Roar Referring Phys:  [place] Phys: Kathlyn Sacramento MD IMPRESSIONS  1. Left ventricular ejection fraction, by estimation, is 50 to 55%. The left ventricle has low normal function. The left ventricle has no regional wall motion abnormalities. There is mild left ventricular hypertrophy. Left ventricular diastolic parameters are consistent with Grade I diastolic dysfunction (impaired relaxation).  2. Right ventricular systolic function is normal. The right ventricular size is normal. There is normal pulmonary artery systolic  pressure.  3. The mitral valve is normal in structure and function. Trivial mitral valve regurgitation. No evidence of mitral stenosis.  4. The aortic valve is normal in structure and function. Aortic valve regurgitation is not visualized. No aortic stenosis is present. FINDINGS  Left Ventricle: Left ventricular ejection fraction, by estimation, is 50 to 55%. The left ventricle has low normal function. The left ventricle has no regional wall motion abnormalities. The left ventricular internal cavity size was normal in size. There is mild left ventricular hypertrophy. Left ventricular diastolic parameters are consistent with Grade I diastolic dysfunction (impaired relaxation). Right Ventricle: The right ventricular size is normal. No increase in right ventricular wall thickness. Right ventricular systolic function is normal. There is normal pulmonary artery systolic pressure. The tricuspid regurgitant velocity is 2.13 m/s, and  with an assumed right atrial pressure of 10 mmHg, the estimated right ventricular systolic pressure is 28.1 mmHg. Left Atrium: Left atrial size was normal in size. Right Atrium: Right atrial size was normal in size. Pericardium: There is no evidence of pericardial effusion. Mitral Valve: The mitral valve is normal in structure and function. Normal mobility of the mitral valve leaflets. Trivial mitral valve regurgitation. No evidence of mitral valve stenosis. Tricuspid Valve: The tricuspid valve is normal in structure. Tricuspid valve regurgitation is trivial. No evidence of tricuspid stenosis. Aortic Valve: The aortic valve is normal in structure and function. Aortic valve regurgitation is not visualized. No aortic stenosis is present. Aortic valve mean gradient measures 4.0 mmHg. Aortic valve peak gradient measures 7.3 mmHg. Aortic valve area, by VTI measures 2.95 cm. Pulmonic Valve: The pulmonic valve was normal in structure. Pulmonic valve regurgitation is trivial. No evidence of pulmonic  stenosis. Aorta: The aortic root is normal in size and structure. Venous: The inferior vena cava was not well visualized. IAS/Shunts: No atrial level shunt detected by color flow Doppler.  LEFT VENTRICLE PLAX 2D LVIDd:         5.20 cm  Diastology LVIDs:         3.91 cm  LV e' lateral:   8.59 cm/s LV PW:         1.15 cm  LV E/e' lateral: 7.8 LV IVS:        1.19 cm  LV e' medial:    5.22 cm/s LVOT diam:     2.10 cm  LV E/e' medial:  12.9 LV SV:         72 LV SV Index:   31 LVOT Area:     3.46 cm  RIGHT VENTRICLE RV Mid diam:    2.96 cm RV S prime:     10.70 cm/s TAPSE (M-mode): 2.0 cm LEFT ATRIUM             Index       RIGHT ATRIUM           Index LA diam:        4.00 cm 1.71 cm/m  RA Area:     15.60 cm LA Vol (A2C):   66.9 ml 28.62 ml/m RA Volume:   35.00 ml  14.97 ml/m LA Vol (A4C):   57.9 ml 24.77 ml/m LA Biplane Vol: 64.5 ml 27.59 ml/m  AORTIC VALVE                   PULMONIC VALVE AV Area (Vmax):    2.92 cm    PV Vmax:        1.18 m/s AV Area (Vmean):   2.54 cm  PV Peak grad:   5.6 mmHg AV Area (VTI):     2.95 cm    RVOT Peak grad: 2 mmHg AV Vmax:           135.00 cm/s AV Vmean:          90.700 cm/s AV VTI:            0.244 m AV Peak Grad:      7.3 mmHg AV Mean Grad:      4.0 mmHg LVOT Vmax:         114.00 cm/s LVOT Vmean:        66.400 cm/s LVOT VTI:          0.208 m LVOT/AV VTI ratio: 0.85  AORTA Ao Root diam: 3.20 cm MITRAL VALVE               TRICUSPID VALVE MV Area (PHT): 4.57 cm    TR Peak grad:   18.1 mmHg MV Decel Time: 166 msec    TR Vmax:        213.00 cm/s MV E velocity: 67.40 cm/s MV A velocity: 94.60 cm/s  SHUNTS MV E/A ratio:  0.71        Systemic VTI:  0.21 m                            Systemic Diam: 2.10 cm Lorine Bears MD Electronically signed by Lorine Bears MD Signature Date/Time: 05/03/2019/7:33:48 PM    Final     Cardiac Studies   Echocardiogram 1. Left ventricular ejection fraction, by estimation, is 50 to 55%. The  left ventricle has low normal function. The left  ventricle has no regional  wall motion abnormalities. There is mild left ventricular hypertrophy.  Left ventricular diastolic  parameters are consistent with Grade I diastolic dysfunction (impaired  relaxation).  2. Right ventricular systolic function is normal. The right ventricular  size is normal. There is normal pulmonary artery systolic pressure.  3. The mitral valve is normal in structure and function. Trivial mitral  valve regurgitation. No evidence of mitral stenosis.  4. The aortic valve is normal in structure and function. Aortic valve  regurgitation is not visualized. No aortic stenosis is present.   Left heart catheterization 1.  Significant underlying three-vessel coronary artery disease.  The culprit is an occluded distal right coronary artery with significant stenosis in the proximal vessel as well.  There is chronic subtotal occlusion of OM1 and borderline significant disease in the mid left circumflex, mid LAD and distal LAD. 2.  Mildly elevated left ventricular end-diastolic pressure.  Left ventricular angiography was not performed. 3.  Successful angioplasty and drug-eluting stent placement to the distal as well as proximal right coronary artery.  The OM1 subtotal occlusion was probed with 2 wires and was not able to cross indicating chronicity.  Recommendations: Dual antiplatelet therapy for at least 1 year. Obtain an echocardiogram. Aggressive treatment of risk factors. Left coronary artery disease can likely be treated with aggressive medical therapy.  FFR guided revascularization can be considered if the patient has residual anginal symptoms.      Patient Profile     Jackson Williamson is a 64 y.o. male with past medical history of severe hyperlipidemia not on treatment and family history of coronary artery disease who presented with chest pain and abnormal EKG. STEMI, taken to catheterization lab yesterday, stent placed to proximal and mid RCA, residual 60% left  circumflex,  70% LAD, also OM disease  Assessment & Plan    STEMI, acute inferior Proximal and distal PCI of the RCA successful Unable to cross OM1 subtotal occlusion, likely chronic Residual mid LAD and left circumflex disease Troponin greater than 27,000 Results discussed in detail Risk factor modification discussed, discussed his family history, brother with CABG --Continue low-dose aspirin 81 mg daily with Effient, Crestor 40 daily -Beta-blocker on hold for bradycardia -ACE ARB on hold secondary to hypotension systolic pressure in the 90s  Hyperlipidemia Crestor 40 daily, goal LDL less than 70, May need to add Zetia if not at goal in 2 or 3 months Previously on Lipitor, denies intolerance  Hypothyroidism Has not had routine follow-up with primary care TSH 12.79 We will discuss with him on rounds on Wednesday, Primary care notes reviewed from 2016, Was on 100 mcg Synthroid in the past as the generic levothyroxine caused GERD  Long discussion with him concerning catheterization findings, potential treatments for residual disease, discussed anginal symptoms to watch for, started discharge planning potentially for tomorrow  Total encounter time more than 35 minutes  Greater than 50% was spent in counseling and coordination of care with the patient   For questions or updates, please contact [facility] Please consult [email or web address] for contact info under        Signed, Julien Nordmann, MD  05/04/2019, 5:36 PM

## 2019-05-04 NOTE — Progress Notes (Signed)
Patient remains AOx4.  He is ambulatory and complains of no pain.  His peripheral pulses are strong.  His right radial site is clean and intact and shows no signs of bleeding.  Patients urine output has been very good. He is consuming 100% of his meals on a HH diet.  He has been eager to ambulate the hallways.  He also voices high spirits about returning home.  Heart sounds S1S2.  Belly sounds active.  He is transferring to room 246 on 2A.

## 2019-05-05 ENCOUNTER — Encounter: Payer: Self-pay | Admitting: Cardiovascular Disease

## 2019-05-05 ENCOUNTER — Telehealth: Payer: Self-pay | Admitting: Cardiovascular Disease

## 2019-05-05 ENCOUNTER — Telehealth: Payer: Self-pay | Admitting: *Deleted

## 2019-05-05 DIAGNOSIS — I5189 Other ill-defined heart diseases: Secondary | ICD-10-CM

## 2019-05-05 DIAGNOSIS — Z8249 Family history of ischemic heart disease and other diseases of the circulatory system: Secondary | ICD-10-CM

## 2019-05-05 MED ORDER — PRASUGREL HCL 10 MG PO TABS: 10.0000 mg | ORAL_TABLET | Freq: Every day | ORAL | 3 refills | Status: DC

## 2019-05-05 MED ORDER — LEVOTHYROXINE SODIUM 50 MCG PO TABS: 50.0000 ug | ORAL_TABLET | Freq: Every day | ORAL | 1 refills | Status: DC

## 2019-05-05 MED ORDER — ROSUVASTATIN CALCIUM 40 MG PO TABS: 40.0000 mg | ORAL_TABLET | Freq: Every day | ORAL | 3 refills | Status: DC

## 2019-05-05 NOTE — Telephone Encounter (Signed)
-----   Message from Lennon Alstrom, PA-C sent at 05/05/2019 11:24 AM EST ----- Regarding: TCM Hello,  This patient was admitted and seen at [facility] for inferior STEMI with catheterization and PCI.  We are expecting discharge today.  Can you please call and arrange / schedule for follow-up TCM appointment with Dr. Kirke Corin or APP within the next two weeks?   Can you also please place in the notes that he will need BB and SL nitro added on to his med list at follow-up if BP/HR allow?  Thank you!  Signed, Lennon Alstrom, PA-C 05/05/2019, 11:24 AM Pager [phone]

## 2019-05-05 NOTE — Telephone Encounter (Signed)
-----   Message from Jacquelyn D Visser, PA-C sent at 05/05/2019 11:24 AM EST ----- Regarding: TCM Hello,  This patient was admitted and seen at [facility] for inferior STEMI with catheterization and PCI.  We are expecting discharge today.  Can you please call and arrange / schedule for follow-up TCM appointment with Dr. Arida or APP within the next two weeks?   Can you also please place in the notes that he will need BB and SL nitro added on to his med list at follow-up if BP/HR allow?  Thank you!  Signed, Jacquelyn D Visser, PA-C 05/05/2019, 11:24 AM Pager [phone]     

## 2019-05-05 NOTE — Telephone Encounter (Signed)
TCM....  Patient discharged today     They are scheduled to see Luther Parody 3/10      They need to be seen within 2 wks      Please call

## 2019-05-05 NOTE — Discharge Summary (Signed)
Discharge Summary    Patient ID: Jackson Williamson MRN: [id number]; DOB: 20-Jun-1955  Admit date: 05/03/2019 Discharge date: 05/05/2019  Primary Care Provider: Ria Bush, MD  Primary Cardiologist: Kathlyn Sacramento, MD  Primary Electrophysiologist:  None   Discharge Diagnoses    Principal Problem:   ST elevation myocardial infarction (STEMI) of inferior wall [facility]) Active Problems:   Hyperlipidemia   CAD (coronary artery disease)   Family history of heart disease   Diastolic dysfunction   Hypothyroidism   Allergies Allergies  Allergen Reactions  . Levothyroxine Other (See Comments)    Severe GERD, can take name brand only    Diagnostic Studies/Procedures    LHC 05/03/19  Mid Cx lesion is 60% stenosed.  1st Mrg lesion is 99% stenosed.  Dist LAD lesion is 90% stenosed.  Mid LAD lesion is 70% stenosed.  Prox LAD to Mid LAD lesion is 40% stenosed.  2nd Diag lesion is 50% stenosed.  1st Diag lesion is 60% stenosed.  Dist RCA lesion is 100% stenosed.  Post intervention, there is a 0% residual stenosis.  A drug-eluting stent was successfully placed using a STENT RESOLUTE ONYX 2.5X15.  Prox RCA lesion is 70% stenosed.  Post intervention, there is a 0% residual stenosis.  A drug-eluting stent was successfully placed using a STENT RESOLUTE ONYX 3.0X15.  RPDA lesion is 100% stenosed. 1.  Significant underlying three-vessel coronary artery disease.  The culprit is an occluded distal right coronary artery with significant stenosis in the proximal vessel as well.  There is chronic subtotal occlusion of OM1 and borderline significant disease in the mid left circumflex, mid LAD and distal LAD. 2.  Mildly elevated left ventricular end-diastolic pressure.  Left ventricular angiography was not performed. 3.  Successful angioplasty and drug-eluting stent placement to the distal as well as proximal right coronary artery.  The OM1 subtotal occlusion was probed with 2 wires and  was not able to cross indicating chronicity. Recommendations: Dual antiplatelet therapy for at least 1 year. Obtain an echocardiogram. Aggressive treatment of risk factors. Left coronary artery disease can likely be treated with aggressive medical therapy.  FFR guided revascularization can be considered if the patient has residual anginal symptoms.    Echo 05/03/19 1. Left ventricular ejection fraction, by estimation, is 50 to 55%. The  left ventricle has low normal function. The left ventricle has no regional  wall motion abnormalities. There is mild left ventricular hypertrophy.  Left ventricular diastolic  parameters are consistent with Grade I diastolic dysfunction (impaired  relaxation).  2. Right ventricular systolic function is normal. The right ventricular  size is normal. There is normal pulmonary artery systolic pressure.  3. The mitral valve is normal in structure and function. Trivial mitral  valve regurgitation. No evidence of mitral stenosis.  4. The aortic valve is normal in structure and function. Aortic valve  regurgitation is not visualized. No aortic stenosis is present.  _____________   History of Present Illness     64 year old male with PMH of severe hyperlipidemia and not on a statin and family history of CAD who presented to [facility] emergency department 05/03/2019 with chest pain and cardiology consulted for abnormal EKG.  Prior to this presentation and admission, he had no previous cardiac history.  He was seen in 2014 by Dr. Rockey Situ for atypical chest and jaw pain with previous cardiac work-up negative at [facility] cardiology.  He had a known history of severe hyperlipidemia and was previously on lovastatin but had stopped taking the medication.  He also had a known history of hypothyroidism but had not been on medication for the last few years.  He reported that he had not seen a primary care physician in more than 3 years.  He denied any history of smoking/tobacco  use; however, he had a strong family history of premature coronary artery disease.    On 05/03/2019, he presented to the [facility] ED with CP that started 30 minutes prior to his arrival.  It was described as substernal and severe in intensity with a heaviness/tightness feeling, as if something was sitting on his chest.  It was associated with shortness of breath.  He had not experienced past chest pain episodes like this before in the past.  It was noted that his previous anginal equivalent was SOB walking up a hill when playing golf, as well as some chest tightness and fatigue with exertion.  Hospital Course     Consultants: None On arrival, EKG showed NSR with minor inferior ST elevation and reciprocal ST depressions in leads I and aVL.  Based on these changes, as well as the symptoms, code STEMI was activated.  He was given sublingual nitroglycerin and unfractioned heparin.  He continued to have chest pain with recommendation for emergent cardiac catheterization and possible PCI.  Emergent cardiac catheterization was performed for inferior STEMI via the right radial artery same day (05/03/2019), and that showed significant underlying three-vessel coronary artery disease.  The culprit was identified as an occluded dRCA with significant stenosis in the proximal vessel as well.  There was chronic subtotal occlusion of OM1 and borderline significant disease of the mid LCx and mLAD, as well as dLAD.  He was noted to have mildly elevated LVEDP.  LV angiography was not performed.  He underwent successful angioplasty with DES to the distal and proximal RCA.  The OM1 subtotal occlusion was probed with 2 wires but not able to be crossed, indicating a chronic occlusion.  Recommendations were for DAPT for at least 1 year.  Echo was obtained and showed normal EF 50-55% with low / normal LV function but no RWMA. There was mild LVH and G1DD, normal RVSF and PASP. Aggressive treatment of risk factors including statin / Crestor  40mg  daily was recommendeded. F/u lipid and liver function to be rechecked in 6-8 weeks starting the start of this statin. It was noted that if his LDL remained above 70 at that time, he should consider a Zetia or PCSK9i given that his LDL in the past was close to 180.  It was noted he was previously on Lipitor; however, he denied intolerance. Left coronary artery disease was instructed to be treated with aggressive medical therapy and cardiac rehab.  It was noted FFR guided revascularization could be considered in the future with residual anginal symptoms. Since he was also not on any treatment for hypothyroidism and had not seen his PCP in a few years, TSH was requested and elevated as below to 12.797 with start of Synthroid. Recommendation is that he find a PCP and follow-up for management of his thyroid dz.   After catheterization, he was admitted and monitored overnight. On 05/04/19, he noted resolution of his previous CP / chest tightness and that he felt better than he had in some time. Telemetry reviewed and NSR. Euvolemic on exam. Given his bradycardia, he was not placed on a BB. Vitals showed SBP 90-low 100s and HR 50s. He was monitored a second night. Today, 3/3, MD has seen and examined the patient today  and feels patient is stable for discharge. Per MD, R radial catheterization site examined and without signs of swelling, infection, or hematoma. Please refer to MD rounding note for physical exam as indicated below. Vitals as below and still significant for hypotension with BP 96/59 and bradycardia 52bpm. Labs show stable renal function and electrolytes with Cr 1.19 and BUN 18 (Baseline Cr 1.05-1.10). HS Tn peaked at greater than 27,000. AST noted to be elevated at 94. Total cholesterol noted to be 238 with LDL 175, HDL 38, and TG 127. WBC elevated at 11.1 with Hgb 14.0 and HCT 41.9. He will be discharged on DAPT with ASA 81mg  daily and prasugrel 10mg  daily; however, BB is precluded by hypotension and  bradycardia. Reconsider addition of BB at RTC. SL nitro also held with consideration of inferior STEMI, as well as given his hypotension. Reconsider at RTC. Labs to collect at RTC to include lipid/LFT in 6-8 weeks, CBC, and BMET. AVS includes instructions regarding follow-up in the office and with cardiac rehabilitation. Message sent to the office to schedule TCM.    _____________  Discharge Vitals Blood pressure (!) 96/59, pulse (!) 52, temperature 98.6 F (37 C), temperature source Oral, resp. rate 18, height 6\' 3"  (1.905 m), weight 104.9 kg, SpO2 98 %.  Filed Weights   05/03/19 1221 05/03/19 1423 05/04/19 1615  Weight: 104.3 kg 105.3 kg 104.9 kg    Labs & Radiologic Studies    CBC Recent Labs    05/03/19 1230 05/03/19 1230 05/03/19 1437 05/04/19 0426  WBC 8.7   < > 9.4 11.1*  NEUTROABS 4.9  --   --   --   HGB 15.8   < > 15.0 14.0  HCT 46.8   < > 44.7 41.9  MCV 90.0   < > 89.8 89.9  PLT 304   < > 276 276   < > = values in this interval not displayed.   Basic Metabolic Panel Recent Labs    07/03/19 1230 05/03/19 1230 05/03/19 1437 05/04/19 0426  NA 139  --   --  140  K 3.8  --   --  4.0  CL 106  --   --  109  CO2 26  --   --  25  GLUCOSE 108*  --   --  103*  BUN 24*  --   --  18  CREATININE 1.26*   < > 1.05 1.19  CALCIUM 9.6  --   --  8.6*   < > = values in this interval not displayed.   Liver Function Tests Recent Labs    05/04/19 0426  AST 94*  ALT 30  ALKPHOS 46  BILITOT 0.9  PROT 6.2*  ALBUMIN 3.6   No results for input(s): LIPASE, AMYLASE in the last 72 hours. Cardiac Enzymes No results for input(s): CKTOTAL, CKMB, CKMBINDEX, TROPONINI in the last 72 hours. BNP Invalid input(s): POCBNP D-Dimer No results for input(s): DDIMER in the last 72 hours. Hemoglobin A1C No results for input(s): HGBA1C in the last 72 hours. Fasting Lipid Panel Recent Labs    05/04/19 0426  CHOL 238*  HDL 38*  LDLCALC 175*  TRIG 127  CHOLHDL 6.3   Thyroid  Function Tests Recent Labs    05/04/19 0426  TSH 12.797*   _____________  CARDIAC CATHETERIZATION  Result Date: 05/03/2019  Mid Cx lesion is 60% stenosed.  1st Mrg lesion is 99% stenosed.  Dist LAD lesion is 90% stenosed.  Mid LAD lesion is  70% stenosed.  Prox LAD to Mid LAD lesion is 40% stenosed.  2nd Diag lesion is 50% stenosed.  1st Diag lesion is 60% stenosed.  Dist RCA lesion is 100% stenosed.  Post intervention, there is a 0% residual stenosis.  A drug-eluting stent was successfully placed using a STENT RESOLUTE ONYX 2.5X15.  Prox RCA lesion is 70% stenosed.  Post intervention, there is a 0% residual stenosis.  A drug-eluting stent was successfully placed using a STENT RESOLUTE ONYX 3.0X15.  RPDA lesion is 100% stenosed.  1.  Significant underlying three-vessel coronary artery disease.  The culprit is an occluded distal right coronary artery with significant stenosis in the proximal vessel as well.  There is chronic subtotal occlusion of OM1 and borderline significant disease in the mid left circumflex, mid LAD and distal LAD. 2.  Mildly elevated left ventricular end-diastolic pressure.  Left ventricular angiography was not performed. 3.  Successful angioplasty and drug-eluting stent placement to the distal as well as proximal right coronary artery.  The OM1 subtotal occlusion was probed with 2 wires and was not able to cross indicating chronicity. Recommendations: Dual antiplatelet therapy for at least 1 year. Obtain an echocardiogram. Aggressive treatment of risk factors. Left coronary artery disease can likely be treated with aggressive medical therapy.  FFR guided revascularization can be considered if the patient has residual anginal symptoms.    ECHOCARDIOGRAM COMPLETE  Result Date: 05/03/2019    ECHOCARDIOGRAM REPORT   Patient Name:   SELDON BARRELL Grasse Date of Exam: 05/03/2019 Medical Rec #:  [id number]     Height:       75.0 in Accession #:    [id number]    Weight:       232.1 lb  Date of Birth:  [birth year]     BSA:          2.338 m Patient Age:    63 years      BP:           152/92 mmHg Patient Gender: M             HR:           59 bpm. Exam Location:  [facility] Procedure: 2D Echo, Cardiac Doppler and Color Doppler Indications:     Acute MI 410  History:         Patient has no prior history of Echocardiogram examinations.                  Hyperlipidemia.  Sonographer:     Neysa Bonito Roar Referring Phys:  [id number] ARIDA Diagnosing Phys: Lorine Bears MD IMPRESSIONS  1. Left ventricular ejection fraction, by estimation, is 50 to 55%. The left ventricle has low normal function. The left ventricle has no regional wall motion abnormalities. There is mild left ventricular hypertrophy. Left ventricular diastolic parameters are consistent with Grade I diastolic dysfunction (impaired relaxation).  2. Right ventricular systolic function is normal. The right ventricular size is normal. There is normal pulmonary artery systolic pressure.  3. The mitral valve is normal in structure and function. Trivial mitral valve regurgitation. No evidence of mitral stenosis.  4. The aortic valve is normal in structure and function. Aortic valve regurgitation is not visualized. No aortic stenosis is present. FINDINGS  Left Ventricle: Left ventricular ejection fraction, by estimation, is 50 to 55%. The left ventricle has low normal function. The left ventricle has no regional wall motion abnormalities. The left ventricular internal cavity size was normal in size. There  is mild left ventricular hypertrophy. Left ventricular diastolic parameters are consistent with Grade I diastolic dysfunction (impaired relaxation). Right Ventricle: The right ventricular size is normal. No increase in right ventricular wall thickness. Right ventricular systolic function is normal. There is normal pulmonary artery systolic pressure. The tricuspid regurgitant velocity is 2.13 m/s, and  with an assumed right atrial pressure of 10 mmHg,  the estimated right ventricular systolic pressure is 28.1 mmHg. Left Atrium: Left atrial size was normal in size. Right Atrium: Right atrial size was normal in size. Pericardium: There is no evidence of pericardial effusion. Mitral Valve: The mitral valve is normal in structure and function. Normal mobility of the mitral valve leaflets. Trivial mitral valve regurgitation. No evidence of mitral valve stenosis. Tricuspid Valve: The tricuspid valve is normal in structure. Tricuspid valve regurgitation is trivial. No evidence of tricuspid stenosis. Aortic Valve: The aortic valve is normal in structure and function. Aortic valve regurgitation is not visualized. No aortic stenosis is present. Aortic valve mean gradient measures 4.0 mmHg. Aortic valve peak gradient measures 7.3 mmHg. Aortic valve area, by VTI measures 2.95 cm. Pulmonic Valve: The pulmonic valve was normal in structure. Pulmonic valve regurgitation is trivial. No evidence of pulmonic stenosis. Aorta: The aortic root is normal in size and structure. Venous: The inferior vena cava was not well visualized. IAS/Shunts: No atrial level shunt detected by color flow Doppler.  LEFT VENTRICLE PLAX 2D LVIDd:         5.20 cm  Diastology LVIDs:         3.91 cm  LV e' lateral:   8.59 cm/s LV PW:         1.15 cm  LV E/e' lateral: 7.8 LV IVS:        1.19 cm  LV e' medial:    5.22 cm/s LVOT diam:     2.10 cm  LV E/e' medial:  12.9 LV SV:         72 LV SV Index:   31 LVOT Area:     3.46 cm  RIGHT VENTRICLE RV Mid diam:    2.96 cm RV S prime:     10.70 cm/s TAPSE (M-mode): 2.0 cm LEFT ATRIUM             Index       RIGHT ATRIUM           Index LA diam:        4.00 cm 1.71 cm/m  RA Area:     15.60 cm LA Vol (A2C):   66.9 ml 28.62 ml/m RA Volume:   35.00 ml  14.97 ml/m LA Vol (A4C):   57.9 ml 24.77 ml/m LA Biplane Vol: 64.5 ml 27.59 ml/m  AORTIC VALVE                   PULMONIC VALVE AV Area (Vmax):    2.92 cm    PV Vmax:        1.18 m/s AV Area (Vmean):   2.54 cm     PV Peak grad:   5.6 mmHg AV Area (VTI):     2.95 cm    RVOT Peak grad: 2 mmHg AV Vmax:           135.00 cm/s AV Vmean:          90.700 cm/s AV VTI:            0.244 m AV Peak Grad:      7.3 mmHg AV Mean  Grad:      4.0 mmHg LVOT Vmax:         114.00 cm/s LVOT Vmean:        66.400 cm/s LVOT VTI:          0.208 m LVOT/AV VTI ratio: 0.85  AORTA Ao Root diam: 3.20 cm MITRAL VALVE               TRICUSPID VALVE MV Area (PHT): 4.57 cm    TR Peak grad:   18.1 mmHg MV Decel Time: 166 msec    TR Vmax:        213.00 cm/s MV E velocity: 67.40 cm/s MV A velocity: 94.60 cm/s  SHUNTS MV E/A ratio:  0.71        Systemic VTI:  0.21 m                            Systemic Diam: 2.10 cm Lorine Bears MD Electronically signed by Lorine Bears MD Signature Date/Time: 05/03/2019/7:33:48 PM    Final    Disposition   Please refer to MD note for physical exam from today.  Pt is being discharged home today in good condition per MD.  Follow-up Plans & Appointments    Follow-up Information    Iran Ouch, MD Follow up.   Specialty: Cardiology Why: You should receive a call from our office in 1-2 days to schedule this appointment. Please call the office if you have not heard from [place] by then. Contact information: [address] [place] [address] [phone]        [facility] Follow up.   Specialty: Cardiac Rehabilitation Why: You should receive a call to schedule your cardiac rehab. Please call the office if this is not scheduled after your discharge. Contact information: [id number] [place] [address] [phone]         Discharge Instructions    AMB Referral to Cardiac Rehabilitation - Phase II   Complete by: As directed    Diagnosis:  STEMI Coronary Stents     After initial evaluation and assessments completed: Virtual Based Care may be provided alone or in conjunction with Phase 2 Cardiac Rehab based on patient  barriers.: Yes   Call MD for:  difficulty breathing, headache or visual disturbances   Complete by: As directed    Call MD for:  extreme fatigue   Complete by: As directed    Call MD for:  hives   Complete by: As directed    Call MD for:  persistant dizziness or light-headedness   Complete by: As directed    Call MD for:  persistant nausea and vomiting   Complete by: As directed    Call MD for:  redness, tenderness, or signs of infection (pain, swelling, redness, odor or green/yellow discharge around incision site)   Complete by: As directed    Call MD for:  severe uncontrolled pain   Complete by: As directed    Diet - low sodium heart healthy   Complete by: As directed    Increase activity slowly   Complete by: As directed       Discharge Medications   Allergies as of 05/05/2019      Reactions   Levothyroxine Other (See Comments)   Severe GERD, can take name brand only      Medication List    TAKE these medications   aspirin  81 MG tablet Take 81 mg by mouth daily. Notes to patient: Patients taking blood thinners should generally stay away from medicines like ibuprofen, Advil, Motrin, naproxen, and Aleve due to risk of stomach bleeding. You may take Tylenol as directed or talk to your primary doctor about alternatives.   levothyroxine 50 MCG tablet Commonly known as: SYNTHROID Take 1 tablet (50 mcg total) by mouth daily at 6 (six) AM. Start taking on: May 06, 2019   multivitamin with minerals Tabs tablet Take 1 tablet by mouth daily.   prasugrel 10 MG Tabs tablet Commonly known as: EFFIENT Take 1 tablet (10 mg total) by mouth daily. Start taking on: May 06, 2019 Notes to patient: Patients taking blood thinners should generally stay away from medicines like ibuprofen, Advil, Motrin, naproxen, and Aleve due to risk of stomach bleeding. You may take Tylenol as directed or talk to your primary doctor about alternatives.   rosuvastatin 40 MG tablet Commonly known as:  CRESTOR Take 1 tablet (40 mg total) by mouth daily at 6 PM.        Yes                               AHA/ACC Clinical Performance & Quality Measures: 1. Aspirin prescribed? - Yes 2. ADP Receptor Inhibitor (Plavix/Clopidogrel, Brilinta/Ticagrelor or Effient/Prasugrel) prescribed (includes medically managed patients)? - Yes 3. Beta Blocker prescribed? - No - hypotension, bradycardia 4. High Intensity Statin (Lipitor 40-80mg  or Crestor 20-40mg ) prescribed? - Yes 5. EF assessed during THIS hospitalization? - Yes 6. For EF <40%, was ACEI/ARB prescribed? - Not Applicable (EF >/= 40%) 7. For EF <40%, Aldosterone Antagonist (Spironolactone or Eplerenone) prescribed? - Not Applicable (EF >/= 40%) 8. Cardiac Rehab Phase II ordered (Included Medically managed Patients)? - Yes     Outstanding Labs/Studies   Follow-up labs to be ordered at RTC in 1-2 weeks from discharge.  Duration of Discharge Encounter   Greater than 30 minutes including physician time.  Signed, Lennon AlstromJacquelyn D Cereniti Curb, PA-C 05/05/2019, 11:22 AM

## 2019-05-05 NOTE — Progress Notes (Signed)
Discharge instructions explained to pt/ verbalized an understanding/ iv and tele removed/ ambulated around nursing station/ tolerated well/ will transport off unit via wheelchair when ride arrives.  

## 2019-05-05 NOTE — Discharge Instructions (Signed)
°  ° °You have received care from [facility] during this hospital stay and we look forward to continuing to provide you with excellent care in our office settings after you've left the hospital.  In order to assure a smoother transition to home following your discharge from the hospital, we will likely have you see one of our nurse practitioners or physician assistants within a few weeks of discharge.  Our advanced practice providers work closely with your physician in order to address all of your heart's needs in a timely manner.  More information about all of our providers may be found here: [email or web address] °  °Please plan to bring all of your prescriptions to your follow-up appointment and don't hesitate to contact us with questions or concerns. ° °[facility] - [phone] °[address] °[address] °[place],  [address] °-------------------------------------- ° °CATHETERIZATION: °NO HEAVY LIFTING X 4 WEEKS. °NO SEXUAL ACTIVITY X 4 WEEKS. °NO SOAKING BATHS, HOT TUBS, POOLS, ETC., X 7 DAYS. ° ° °Radial Site Care °Refer to this sheet in the next few weeks. These instructions provide you with information on caring for yourself after your procedure. Your caregiver may also give you more specific instructions. Your treatment has been planned according to current medical practices, but problems sometimes occur. Call your caregiver if you have any problems or questions after your procedure. °HOME CARE INSTRUCTIONS °· You may shower the day after the procedure. Remove the bandage (dressing) and gently wash the site with plain soap and water. Gently pat the site dry.  °· Do not apply powder or lotion to the site.  °· Do not submerge the affected site in water for 3 to 5 days.  °· Inspect the site at least twice daily.  °· Do not flex or bend the affected arm for 24 hours.  °· No lifting over 5 pounds (2.3 kg) for  5 days after your procedure.  °What to expect: °· Any bruising will usually fade within 1 to 2 weeks.  °· Blood that collects in the tissue (hematoma) may be painful to the touch. It should usually decrease in size and tenderness within 1 to 2 weeks.  °SEEK IMMEDIATE MEDICAL CARE IF: °· You have unusual pain at the radial site.  °· You have redness, warmth, swelling, or pain at the radial site.  °· You have drainage (other than a small amount of blood on the dressing).  °· You have chills.  °· You have a fever or persistent symptoms for more than 72 hours.  °· You have a fever and your symptoms suddenly get worse.  °· Your arm becomes pale, cool, tingly, or numb.  °· You have heavy bleeding from the site. Hold pressure on the site.  ° ° °  ° °10 Habits of Highly Healthy People ° °[facility] wants to help you get well and stay well.  Live a longer, healthier life by practicing healthy habits every day. ° °1.  Visit your primary care provider regularly. °2.  Make time for family and friends.  Healthy relationships are important. °3.  Take medications as directed by your provider. °4.  Maintain a healthy weight and a trim waistline. °5.  Eat healthy meals and snacks, rich in fruits, vegetables, whole grains, and lean proteins. °6.  Get moving every day - aim for 150 minutes of moderate physical activity each week. °7.  Don't smoke. °8.  Avoid alcohol or drink in moderation. °9.  Manage stress through   meditation or mindful relaxation. °10.  Get seven to nine hours of quality sleep each night. ° °Want more information on healthy habits?  To learn more about these and other healthy habits, visit [email or web address]. ° °

## 2019-05-05 NOTE — Telephone Encounter (Signed)
Spoke with patient and he verbalized understanding of all discharge instructions, confirmed appointment, and had no further questions at this time.

## 2019-05-05 NOTE — Telephone Encounter (Signed)
Patient contacted regarding discharge from [facility] on 05/05/19.  Patient understands to follow up with provider Gillian Shields, NP on 05/12/19 at 2pm at the [facility] office. Patient understands discharge instructions? yes Patient understands medications and regiment? yes Patient understands to bring all medications to this visit? yes  Ask patient:  Are you enrolled in My Chart (yes or no)  If no ask patient if they would like to enroll.     For patients Patient has no question or concerns at this time. He will be staying with his daughter who is a [employer] fundraiser.

## 2019-05-12 ENCOUNTER — Ambulatory Visit (INDEPENDENT_AMBULATORY_CARE_PROVIDER_SITE_OTHER): Payer: 59 | Admitting: Family

## 2019-05-12 ENCOUNTER — Encounter: Payer: Self-pay | Admitting: Family

## 2019-05-12 ENCOUNTER — Other Ambulatory Visit: Payer: Self-pay

## 2019-05-12 VITALS — BP 132/90 | HR 58 | Ht 75.0 in | Wt 232.5 lb

## 2019-05-12 DIAGNOSIS — I2510 Atherosclerotic heart disease of native coronary artery without angina pectoris: Secondary | ICD-10-CM

## 2019-05-12 DIAGNOSIS — E782 Mixed hyperlipidemia: Secondary | ICD-10-CM | POA: Diagnosis not present

## 2019-05-12 NOTE — Progress Notes (Signed)
Office Visit    Patient Name: Jackson Williamson Date of Encounter: 05/12/2019  Primary Care Provider:  Ria Bush, MD Primary Cardiologist:  Kathlyn Sacramento, MD Electrophysiologist:  None   Chief Complaint    SAEL FURCHES is a 64 y.o. male with a hx of CAD, HLD, hypothyroidism presents today for follow-up after hospitalization for inferior STEMI with cath and PCI  Past Medical History    Past Medical History:  Diagnosis Date  . 3-vessel CAD    LHC 05/03/2019: Sig 3v CAD, dRCA 100% /pRCA 70% RCA --> p/dRCA with DES, OM1 99% probed, mLCx 60%, mLAD 70%, dLAD 90%, mild elv LVEDP,   . Diastolic dysfunction    3/1 echo EF 50-55%, G1DD  . ED (erectile dysfunction)   . GERD (gastroesophageal reflux disease)   . History of chicken pox   . Hyperlipidemia   . Hypothyroidism    Past Surgical History:  Procedure Laterality Date  . CORONARY/GRAFT ACUTE MI REVASCULARIZATION N/A 05/03/2019   Procedure: Coronary/Graft Acute MI Revascularization;  Surgeon: Wellington Hampshire, MD;  Location: [place];  Service: Cardiovascular;  Laterality: N/A;  . DOBUTAMINE STRESS ECHO  2013   normal per patient  . HERNIA REPAIR Right 11/5636   RIH and umbilical  . KNEE SURGERY  [phone]   left knee  . LEFT HEART CATH AND CORONARY ANGIOGRAPHY N/A 05/03/2019   Procedure: LEFT HEART CATH AND CORONARY ANGIOGRAPHY;  Surgeon: Wellington Hampshire, MD;  Location: [place] [facility];  Service: Cardiovascular;  Laterality: N/A;    Allergies  Allergies  Allergen Reactions  . Levothyroxine Other (See Comments)    Severe GERD, can take name brand only    History of Present Illness    Jackson Williamson is a 64 y.o. male with a hx of CAD, HLD, hypothyroidism.  He was last seen while hospitalized.  Previously seen by Dr. Colman Cater for atypical chest and jaw pain in 2014 with previous cardiac work-up negative [facility] cardiology.  He has a strong family history of premature coronary artery disease.   Previous intolerance to Lipitor.  Admitted 05/03/2019 to [facility] with chest pain.  He had been off all medications for a few years.  His EKG showed minor inferior ST elevation and reciprocal ST depression in leads I and aVL.  Code STEMI was activated.  Underwent emergent cardiac catheterization 05/03/2019 with significant underlying three-vessel CAD.  Culprit identified as occluded D RCA with significant stenosis in proximal vessel as well.  Noted chronic subtotal occlusion of OM1 and borderline significant disease of mid LCx and mid LAD as well as distal LAD.  Mildly elevated LVEDP.  Underwent angioplasty with DES to distal proximal RCA.  OM1 subtotal occlusion probed with 2 wires but could not be crossed indicating chronic occlusion.  Recommended for DAPT for at least 1 year.  Echo during admission with LVEF 50 to 65% and no R WMA, mild LVH, grade 1 diastolic dysfunction.  Addition of beta-blocker was precluded by hypotension and bradycardia.  Reports no chest pain, pressure, tightness. Reports no SOB nor DOE. Tells me he feels much better since discharge. We discussed the importance of following up with primary care regarding his hypothyroidism.   Reviewed LDL goal of less than 70. He exercises three times per week regularly. He eats a heart healthy diet, avoids fried food and salt. Tells me he has further reduced intake of junk foods since hospital discharge.   We reviewed his cardiac catheterization in  detail. He verbalized understanding of the importance of his cardiac medications.   EKGs/Labs/Other Studies Reviewed:   The following studies were reviewed today:  Echocardiogram 05/03/2019 . Left ventricular ejection fraction, by estimation, is 50 to 55%. The  left ventricle has low normal function. The left ventricle has no regional  wall motion abnormalities. There is mild left ventricular hypertrophy.  Left ventricular diastolic  parameters are consistent with Grade I diastolic dysfunction (impaired   relaxation).   2. Right ventricular systolic function is normal. The right ventricular  size is normal. There is normal pulmonary artery systolic pressure.   3. The mitral valve is normal in structure and function. Trivial mitral  valve regurgitation. No evidence of mitral stenosis.   4. The aortic valve is normal in structure and function. Aortic valve  regurgitation is not visualized. No aortic stenosis is present.   LHC 05/03/2019  Mid Cx lesion is 60% stenosed.  1st Mrg lesion is 99% stenosed.  Dist LAD lesion is 90% stenosed.  Mid LAD lesion is 70% stenosed.  Prox LAD to Mid LAD lesion is 40% stenosed.  2nd Diag lesion is 50% stenosed.  1st Diag lesion is 60% stenosed.  Dist RCA lesion is 100% stenosed.  Post intervention, there is a 0% residual stenosis.  A drug-eluting stent was successfully placed using a STENT RESOLUTE ONYX 2.5X15.  Prox RCA lesion is 70% stenosed.  Post intervention, there is a 0% residual stenosis.  A drug-eluting stent was successfully placed using a STENT RESOLUTE ONYX 3.0X15.  RPDA lesion is 100% stenosed.   1.  Significant underlying three-vessel coronary artery disease.  The culprit is an occluded distal right coronary artery with significant stenosis in the proximal vessel as well.  There is chronic subtotal occlusion of OM1 and borderline significant disease in the mid left circumflex, mid LAD and distal LAD. 2.  Mildly elevated left ventricular end-diastolic pressure.  Left ventricular angiography was not performed. 3.  Successful angioplasty and drug-eluting stent placement to the distal as well as proximal right coronary artery.  The OM1 subtotal occlusion was probed with 2 wires and was not able to cross indicating chronicity.   Recommendations: Dual antiplatelet therapy for at least 1 year. Obtain an echocardiogram. Aggressive treatment of risk factors. Left coronary artery disease can likely be treated with aggressive medical  therapy.  FFR guided revascularization can be considered if the patient has residual anginal symptoms.    EKG:  EKG is  ordered today.  The ekg ordered today demonstrates SB 58 bpm with known prior inferior infarct - TWI in lead III and aVF with lead III showing improvement since hospitalization. Noa cute ST/T wave changes.   Recent Labs: 05/04/2019: ALT 30; BUN 18; Creatinine, Ser 1.19; Hemoglobin 14.0; Platelets 276; Potassium 4.0; Sodium 140; TSH 12.797  Recent Lipid Panel    Component Value Date/Time   CHOL 238 (H) 05/04/2019 0426   CHOL 155 07/23/2012 0909   TRIG 127 05/04/2019 0426   TRIG 97 07/23/2012 0909   HDL 38 (L) 05/04/2019 0426   HDL 43 07/23/2012 0909   CHOLHDL 6.3 05/04/2019 0426   VLDL 25 05/04/2019 0426   LDLCALC 175 (H) 05/04/2019 0426   LDLCALC 93 07/23/2012 0909   Home Medications   Current Meds  Medication Sig  . aspirin 81 MG tablet Take 81 mg by mouth daily.   Marland Kitchen levothyroxine (SYNTHROID) 50 MCG tablet Take 1 tablet (50 mcg total) by mouth daily at 6 (six) AM.  .  Multiple Vitamin (MULTIVITAMIN WITH MINERALS) TABS tablet Take 1 tablet by mouth daily.  . prasugrel (EFFIENT) 10 MG TABS tablet Take 1 tablet (10 mg total) by mouth daily.  . rosuvastatin (CRESTOR) 40 MG tablet Take 1 tablet (40 mg total) by mouth daily at 6 PM.    Review of Systems      Review of Systems  Constitution: Negative for chills, fever and malaise/fatigue.  Cardiovascular: Negative for chest pain, dyspnea on exertion, leg swelling, near-syncope, orthopnea, palpitations and syncope.  Respiratory: Negative for cough, shortness of breath and wheezing.   Gastrointestinal: Negative for nausea and vomiting.  Neurological: Negative for dizziness, light-headedness and weakness.   All other systems reviewed and are otherwise negative except as noted above.  Physical Exam    VS:  BP 132/90 (BP Location: Left Arm, Patient Position: Sitting, Cuff Size: Normal)   Pulse (!) 58   Ht 6\' 3"  (1.905  m)   Wt 232 lb 8 oz (105.5 kg)   SpO2 98%   BMI 29.06 kg/m  , BMI Body mass index is 29.06 kg/m. GEN: Well nourished, well developed, in no acute distress. HEENT: normal. Neck: Supple, no JVD, carotid bruits, or masses. Cardiac: RRR, no murmurs, rubs, or gallops. No clubbing, cyanosis, edema.  Radials/DP/PT 2+ and equal bilaterally.  Respiratory:  Respirations regular and unlabored, clear to auscultation bilaterally. GI: Soft, nontender, nondistended, BS + x 4. MS: No deformity or atrophy. Skin: Warm and dry, no rash. R radial cath site clean, dry, intact with no erythema nor ecchymosis.  Neuro:  Strength and sensation are intact. Psych: Normal affect.   Assessment & Plan    1. CAD s/p STEMI - Recent DESx2 to RCA 05/03/19. Stable with no anginal symptoms. R radial cath site healing appropriately. Recommended for DAPT x12 months, continue Aspirin/Effient, no bleeding complications. Continue high intensity statin. No beta blocker due to bradycardia. Consider addition at follow up if HR allows.   2. Diastolic dysfunction - Noted on echocardiogram 05/03/19 with LVEF 50-55%, no RWMA, mild LVH. No signs of volume overload. No indication for diuresis. Continue to optimize blood pressure and volume status.  3. HLD, LDL goal <70 - 05/04/19 LDL 07/04/19 while on no statin. Crestor 40mg  initiated while admitted. Repeat lipid panel/liver function at follow up in 2 months. If LDL above goal, add Zetia 10mg  daily.  4. Hypothyroidism - Encouraged to follow up with primary care. Continue present levothyroxine dose. Further refills will need to come from primary care.    Disposition: Follow up in 2 month(s) with Dr. 409.    , NP 05/12/2019, 2:28 PM

## 2019-05-12 NOTE — Patient Instructions (Addendum)
Medication Instructions:  No medication changes today.  Continue Aspirin and Prasugrel to protect your stent.  *If you need a refill on your cardiac medications before your next appointment, please call your pharmacy*  Lab Work: No lab work today. Will plan to get lipid panel at next office visit so please fast prior to appointment.   Testing/Procedures: Your EKG today showed normal sinus rhythm. The changes that were occurring in the hospital during your heart attack are much improved today. Good result!  Follow-Up: At [facility], you and your health needs are our priority.  As part of our continuing mission to provide you with exceptional heart care, we have created designated Provider Care Teams.  These Care Teams include your primary Cardiologist (physician) and Advanced Practice Providers (APPs -  Physician Assistants and Nurse Practitioners) who all work together to provide you with the care you need, when you need it.  We recommend signing up for the patient portal called "MyChart".  Sign up information is provided on this After Visit Summary.  MyChart is used to connect with patients for Virtual Visits (Telemedicine).  Patients are able to view lab/test results, encounter notes, upcoming appointments, etc.  Non-urgent messages can be sent to your provider as well.   To learn more about what you can do with MyChart, go to [email or web address].    Your next appointment:   2 month(s)  The format for your next appointment:   In Person  Provider:   You may see Lorine Bears, MD or one of the following Advanced Practice Providers on your designated Care Team:    Nicolasa Ducking, NP  Eula Listen, PA-C  Marisue Ivan, PA-C  Other Instructions  Would recommend re-establishing with primary care for management of hypothyroidism. [facility] phone number is [phone]. All of their providers are wonderful.  Glad you are doing so well after  your stents! We have requested a new stent card from the catheterization lab and will either mail or give to you at your next appointment.

## 2019-05-13 ENCOUNTER — Telehealth: Payer: Self-pay

## 2019-05-13 ENCOUNTER — Other Ambulatory Visit: Payer: Self-pay

## 2019-05-13 ENCOUNTER — Emergency Department
Admission: EM | Admit: 2019-05-13 | Discharge: 2019-05-13 | Disposition: A | Discharge status: Home / Self Care | Payer: 59 | Attending: Emergency Medicine | Admitting: Emergency Medicine

## 2019-05-13 ENCOUNTER — Telehealth: Payer: Self-pay | Admitting: Family

## 2019-05-13 ENCOUNTER — Telehealth: Payer: Self-pay | Admitting: Family Medicine

## 2019-05-13 ENCOUNTER — Encounter: Payer: Self-pay | Admitting: Emergency Medicine

## 2019-05-13 DIAGNOSIS — Z5321 Procedure and treatment not carried out due to patient leaving prior to being seen by health care provider: Secondary | ICD-10-CM | POA: Insufficient documentation

## 2019-05-13 DIAGNOSIS — R6884 Jaw pain: Secondary | ICD-10-CM | POA: Diagnosis present

## 2019-05-13 LAB — CBC
HCT: 43 % (ref 39.0–52.0)
Hemoglobin: 14.5 g/dL (ref 13.0–17.0)
MCH: 30.1 pg (ref 26.0–34.0)
MCHC: 33.7 g/dL (ref 30.0–36.0)
MCV: 89.4 fL (ref 80.0–100.0)
Platelets: 340 10*3/uL (ref 150–400)
RBC: 4.81 MIL/uL (ref 4.22–5.81)
RDW: 12.3 % (ref 11.5–15.5)
WBC: 7.8 10*3/uL (ref 4.0–10.5)
nRBC: 0 % (ref 0.0–0.2)

## 2019-05-13 LAB — BASIC METABOLIC PANEL
Anion gap: 10 (ref 5–15)
BUN: 24 mg/dL — ABNORMAL HIGH (ref 8–23)
CO2: 20 mmol/L — ABNORMAL LOW (ref 22–32)
Calcium: 9.1 mg/dL (ref 8.9–10.3)
Chloride: 106 mmol/L (ref 98–111)
Creatinine, Ser: 1.15 mg/dL (ref 0.61–1.24)
GFR calc Af Amer: >60 mL/min (ref >60)
GFR calc non Af Amer: >60 mL/min (ref >60)
Glucose, Bld: 98 mg/dL (ref 70–99)
Potassium: 3.7 mmol/L (ref 3.5–5.1)
Sodium: 136 mmol/L (ref 135–145)

## 2019-05-13 LAB — GLUCOSE, CAPILLARY: Glucose-Capillary: 96 mg/dL (ref 70–99)

## 2019-05-13 MED ORDER — SODIUM CHLORIDE 0.9% FLUSH: 3.0000 mL | Freq: Once | INTRAVENOUS | Status: DC

## 2019-05-13 NOTE — ED Notes (Signed)
Patient called in the ED to take back to a room, no response.  Patient called at home, patient states he left the ED.  Patient states he is feeling better and will follow up with his doctor. Patient encouraged to return to the ED for chest pain or if he feels dizzy or has near syncope in the future.

## 2019-05-13 NOTE — Telephone Encounter (Signed)
Patients stent card brought down to our office from the cath lab. Placed it in the outgoing mail to be mailed to the patient. Copy made of the card and placed in the scan in box at the front desk to be scanned into the patients chart.

## 2019-05-13 NOTE — Telephone Encounter (Signed)
Ok to set up with me 30 min slot. Thanks.

## 2019-05-13 NOTE — ED Triage Notes (Signed)
Pt presents to ED via POV, pt states was walking while playing golf, and walking up hill, pt states bent down to put his tee in the ground and became dizzy and almost passed out. Pt presents alert and oriented on arrival to ED.  Pt states only eaten small amounts of food today.

## 2019-05-13 NOTE — Telephone Encounter (Signed)
Late entry: The patient was seen in the office with Jackson Shields, NP on 05/12/19. He states he lost his stent card. I spoke with Corrie Dandy in the [facility] cath lab yesterday afternoon about obtaining a replacement card. She stated she will see what she can do to try to obtain this for the patient.

## 2019-05-13 NOTE — Telephone Encounter (Signed)
Pt called to schedule an appointment. States he recently had a heart attack and he wants to get back on track with regular check ups. Is he okay to re-establish with you since it's been almost 5 years since he was seen last or does he need to establish with another provider?

## 2019-05-14 NOTE — Telephone Encounter (Signed)
Called and scheduled pt for 4/1

## 2019-05-17 ENCOUNTER — Telehealth: Payer: Self-pay | Admitting: Cardiovascular Disease

## 2019-05-17 ENCOUNTER — Telehealth: Payer: Self-pay | Admitting: Emergency Medicine

## 2019-05-17 NOTE — Telephone Encounter (Signed)
Pt c/o medication issue:  1. Name of Medication:  Blood thinner   2. How are you currently taking this medication (dosage and times per day)?   Unknown dose once daily  3. Are you having a reaction (difficulty breathing--STAT)?  Dizziness pre syncopal   4. What is your medication issue? Patient recent ed visit was normal per patient and he thinks maybe he is having symptoms from blood thinner.  Patient reports episode happened after 9 holes of golf and that this seems to be a feeling he gets about 10-12 hrs after taking the med.    Declined to schedule sooner fu .

## 2019-05-17 NOTE — Telephone Encounter (Signed)
Called patient due to lwot to inquire about condition and follow up plans.he has not informed his doctor of the symptom he had.  I advised that he should call and inform his doctor and that his doctor would be able to review his labs and ekg as well.

## 2019-05-17 NOTE — Telephone Encounter (Signed)
I do not think the dizziness is related to the blood thinners.  Recent labs showed no evidence of anemia.  Hypothyroidism could be causing some bradycardia leading to dizziness.

## 2019-05-17 NOTE — Telephone Encounter (Addendum)
Spoke with the patient. Patient sts that he is not completely sure if his antiplatelet is causing the episodes of dizziness or pre-syncope. Patient could not provide any VS.  His 05/04/19 TSH demonstrated hypothyroidism and Gillian Shields, NP started him on levothyroxine with instructions for him to f/u with his pcp for management. He is scheduled with Dr. Nicanor Alcon office on 06/03/19.  Advise the patient that I did not think his symptoms were related to his DAPT. On further conversation pt sts that he did have dizziness prior to his MI.  Adv the patient that the dizziness or light headiness could possibly be related to his hypothyroidism.    Advised the patient that I will fwd the message to Dr. Kirke Corin and call back with his recommendation.

## 2019-05-18 NOTE — Telephone Encounter (Signed)
Patient made aware of Dr. Arida's response with verbalized understanding. 

## 2019-06-03 ENCOUNTER — Encounter: Payer: Self-pay | Admitting: Family Medicine

## 2019-06-03 ENCOUNTER — Ambulatory Visit (INDEPENDENT_AMBULATORY_CARE_PROVIDER_SITE_OTHER): Payer: 59 | Admitting: Family Medicine

## 2019-06-03 ENCOUNTER — Other Ambulatory Visit: Payer: Self-pay

## 2019-06-03 VITALS — BP 126/76 | HR 60 | Temp 97.9°F | Ht 75.0 in | Wt 225.6 lb

## 2019-06-03 DIAGNOSIS — E782 Mixed hyperlipidemia: Secondary | ICD-10-CM

## 2019-06-03 DIAGNOSIS — I2510 Atherosclerotic heart disease of native coronary artery without angina pectoris: Secondary | ICD-10-CM

## 2019-06-03 DIAGNOSIS — Z1211 Encounter for screening for malignant neoplasm of colon: Secondary | ICD-10-CM

## 2019-06-03 DIAGNOSIS — E039 Hypothyroidism, unspecified: Secondary | ICD-10-CM

## 2019-06-03 DIAGNOSIS — Z8249 Family history of ischemic heart disease and other diseases of the circulatory system: Secondary | ICD-10-CM

## 2019-06-03 DIAGNOSIS — K219 Gastro-esophageal reflux disease without esophagitis: Secondary | ICD-10-CM | POA: Diagnosis not present

## 2019-06-03 LAB — TSH: TSH: 8.74 u[IU]/mL — ABNORMAL HIGH (ref 0.35–4.50)

## 2019-06-03 LAB — T4, FREE: Free T4: 0.94 ng/dL (ref 0.60–1.60)

## 2019-06-03 MED ORDER — CO Q-10 100 MG PO CAPS: 1.0000 | ORAL_CAPSULE | Freq: Every day | ORAL | 0 refills | Status: AC

## 2019-06-03 MED ORDER — OMEGA 3 1000 MG PO CAPS: 1.0000 | ORAL_CAPSULE | Freq: Every day | ORAL | Status: DC

## 2019-06-03 NOTE — Progress Notes (Signed)
This visit was conducted in person.  BP 126/76 (BP Location: Left Arm, Patient Position: Sitting, Cuff Size: Large)   Pulse 60   Temp 97.9 F (36.6 C) (Temporal)   Ht 6' 3"  (1.905 m)   Wt 225 lb 9 oz (102.3 kg)   SpO2 99%   BMI 28.19 kg/m    CC: re establish care Subjective:    Patient ID: Jackson Williamson, male    DOB: 08-11-1955, 64 y.o.   MRN: [id number]  HPI: Jackson Williamson is a 64 y.o. male presenting on 06/03/2019 for Re-establish   Last seen 2016. 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 Planned DAPT x12 months. Heart attack presented with dull chest ache.   Hypothyroidism - back on 26mg daily. Fatigue is improved. Some orthostatic dizziness initially which has since improved. Denies constipation, cold intolerance, skin or hair changes, or unexpected weight changes.   Has changed diet to healthier options - drinking water, unsweet iced tea and 2 cups coffee/day, eating chicken, fish, colored vegetables.   GERD - attributed to generic levothyroxine, better with switch to brand Synthroid. Also drinks olive oil and lemon juice every morning.   Colon cancer screening - discussed options - will start with iFOB.      Relevant past medical, surgical, family and social history reviewed and updated as indicated. Interim medical history since our last visit reviewed. Allergies and medications reviewed and updated. Outpatient Medications Prior to Visit  Medication Sig Dispense Refill  . aspirin 81 MG tablet Take 81 mg by mouth daily.     .Marland Kitchenlevothyroxine (SYNTHROID) 50 MCG tablet Take 1 tablet (50 mcg total) by mouth daily at 6 (six) AM. 90 tablet 1  . Multiple Vitamin (MULTIVITAMIN WITH MINERALS) TABS tablet Take 1 tablet by mouth daily.    . prasugrel (EFFIENT) 10 MG TABS tablet Take 1 tablet (10 mg total) by mouth daily. 90 tablet 3  . rosuvastatin (CRESTOR) 40 MG tablet Take 1 tablet (40 mg total) by mouth daily at 6 PM. 90  tablet 3   No facility-administered medications prior to visit.     Per HPI unless specifically indicated in ROS section below Review of Systems Objective:    BP 126/76 (BP Location: Left Arm, Patient Position: Sitting, Cuff Size: Large)   Pulse 60   Temp 97.9 F (36.6 C) (Temporal)   Ht 6' 3"  (1.905 m)   Wt 225 lb 9 oz (102.3 kg)   SpO2 99%   BMI 28.19 kg/m   Wt Readings from Last 3 Encounters:  06/03/19 225 lb 9 oz (102.3 kg)  05/13/19 230 lb (104.3 kg)  05/12/19 232 lb 8 oz (105.5 kg)    Physical Exam Vitals and nursing note reviewed.  Constitutional:      Appearance: Normal appearance. He is not ill-appearing.  HENT:     Head: Normocephalic and atraumatic.  Eyes:     Extraocular Movements: Extraocular movements intact.     Conjunctiva/sclera: Conjunctivae normal.     Pupils: Pupils are equal, round, and reactive to light.  Neck:     Thyroid: No thyromegaly or thyroid tenderness.  Cardiovascular:     Rate and Rhythm: Normal rate and regular rhythm.     Pulses: Normal pulses.     Heart sounds: Normal heart sounds. No murmur.  Pulmonary:     Effort: Pulmonary effort is normal. No respiratory distress.     Breath sounds: Normal  breath sounds. No wheezing, rhonchi or rales.  Musculoskeletal:        General: Normal range of motion.     Cervical back: Normal range of motion and neck supple. No rigidity or tenderness.     Right lower leg: No edema.     Left lower leg: No edema.  Lymphadenopathy:     Cervical: No cervical adenopathy.  Skin:    General: Skin is warm and dry.     Findings: No rash.  Neurological:     Mental Status: He is alert.  Psychiatric:        Mood and Affect: Mood normal.        Behavior: Behavior normal.       Results for orders placed or performed in visit on 06/03/19  TSH  Result Value Ref Range   TSH 8.74 (H) 0.35 - 4.50 uIU/mL  T4, free  Result Value Ref Range   Free T4 0.94 0.60 - 1.60 ng/dL   Lab Results  Component Value Date    CHOL 238 (H) 05/04/2019   HDL 38 (L) 05/04/2019   LDLCALC 175 (H) 05/04/2019   TRIG 127 05/04/2019   CHOLHDL 6.3 05/04/2019    Assessment & Plan:  This visit occurred during the SARS-CoV-2 public health emergency.  Safety protocols were in place, including screening questions prior to the visit, additional usage of staff PPE, and extensive cleaning of exam room while observing appropriate contact time as indicated for disinfecting solutions.   Problem List Items Addressed This Visit    Hypothyroidism - Primary    Continue brand Synthroid, update TFTs.  Asymptomatic from thyroid standpoint.      Relevant Orders   TSH (Completed)   T4, free (Completed)   Hyperlipidemia    Continue crestor in recent h/o MI.  The ASCVD Risk score Mikey Bussing DC Jr., et al., 2013) failed to calculate for the following reasons:   The patient has a prior MI or stroke diagnosis       GERD (gastroesophageal reflux disease)    Related to generic levothyroxine. Does well when on Synthroid. Also finds olive oil and lemon juice helps.       Family history of heart disease   CAD (coronary artery disease)    Appreciate cards care. Plan for DAPT for 12 months, continue statin. To early to recheck FLP.        Other Visit Diagnoses    Special screening for malignant neoplasms, colon       Relevant Orders   Fecal occult blood, imunochemical       Meds ordered this encounter  Medications  . Coenzyme Q10 (CO Q-10) 100 MG CAPS    Sig: Take 1 capsule by mouth daily.    Refill:  0  . DISCONTD: Omega 3 1000 MG CAPS    Sig: Take 1 capsule (1,000 mg total) by mouth daily.    Dispense:  60 capsule   Orders Placed This Encounter  Procedures  . Fecal occult blood, imunochemical    Standing Status:   Future    Standing Expiration Date:   06/02/2020  . TSH  . T4, free    Patient Instructions  Thyroid labs today Good to see you today I'm glad you're doing better. Continue current medicines.  Pass by lab to  pick up stool kit. Return in 6 months for physical.    Follow up plan: Return in about 6 months (around 12/03/2019).  Ria Bush, MD

## 2019-06-03 NOTE — Patient Instructions (Addendum)
Thyroid labs today Good to see you today I'm glad you're doing better. Continue current medicines.  Pass by lab to pick up stool kit. Return in 6 months for physical.

## 2019-06-04 ENCOUNTER — Other Ambulatory Visit: Payer: Self-pay | Admitting: Family Medicine

## 2019-06-04 MED ORDER — SYNTHROID 75 MCG PO TABS: 75.0000 ug | ORAL_TABLET | Freq: Every day | ORAL | 2 refills | Status: DC

## 2019-06-04 NOTE — Assessment & Plan Note (Signed)
Continue crestor in recent h/o MI.  The ASCVD Risk score Jackson George DC Jr., et al., 2013) failed to calculate for the following reasons:   The patient has a prior MI or stroke diagnosis

## 2019-06-04 NOTE — Assessment & Plan Note (Addendum)
Appreciate cards care. Plan for DAPT for 12 months, continue statin. To early to recheck FLP.

## 2019-06-04 NOTE — Assessment & Plan Note (Signed)
Continue brand Synthroid, update TFTs.  Asymptomatic from thyroid standpoint.

## 2019-06-04 NOTE — Assessment & Plan Note (Signed)
Related to generic levothyroxine. Does well when on Synthroid. Also finds olive oil and lemon juice helps.

## 2019-06-25 ENCOUNTER — Telehealth: Payer: Self-pay

## 2019-06-25 NOTE — Telephone Encounter (Signed)
Received faxed PA request for Synthroid 75 mg tab; key:  [id number].  Decision pending.

## 2019-06-29 NOTE — Telephone Encounter (Signed)
Received faxed PA denial.  Reason:  The brand Synthroid is covered only if doctor submits medical records documenting that pt had and inadequate resopnsed, failed or cannot use levothyroxine. [I indicated that generic levothyroxine caused GERD, per office note.]  The information provided does not show that you meet the criteria listed above.  FYI to Dr. Reece Agar.

## 2019-06-29 NOTE — Telephone Encounter (Signed)
Can we resubmit request - patient failed generic levothyroxine due intolerable side effects and less effective than brand synthroid. Do we need to send my last office note to get this approved?

## 2019-07-06 NOTE — Telephone Encounter (Signed)
Faxed a letter (with info from Dr. Reece Agar below), recent OV notes and the appeal letter to [employer] at [phone].  Decision pending.

## 2019-07-08 NOTE — Telephone Encounter (Signed)
After several failed faxes, spoke with Abby of [employer].  She provided fax # [phone] to send appeal.  Ref # for call:  [id number].   Faxed form.  Decision pending.

## 2019-07-08 NOTE — Telephone Encounter (Signed)
After several more failed attempts to fax PA appeal to new fax #, info was mailed to:   [employer] [address] [address] [place], [place]  [phone].

## 2019-07-22 ENCOUNTER — Ambulatory Visit (INDEPENDENT_AMBULATORY_CARE_PROVIDER_SITE_OTHER): Payer: 59 | Admitting: Cardiovascular Disease

## 2019-07-22 ENCOUNTER — Encounter: Payer: Self-pay | Admitting: Cardiovascular Disease

## 2019-07-22 ENCOUNTER — Other Ambulatory Visit: Payer: Self-pay

## 2019-07-22 VITALS — BP 134/90 | HR 51 | Ht 75.0 in | Wt 223.1 lb

## 2019-07-22 DIAGNOSIS — I10 Essential (primary) hypertension: Secondary | ICD-10-CM | POA: Diagnosis not present

## 2019-07-22 DIAGNOSIS — I2510 Atherosclerotic heart disease of native coronary artery without angina pectoris: Secondary | ICD-10-CM

## 2019-07-22 DIAGNOSIS — E782 Mixed hyperlipidemia: Secondary | ICD-10-CM | POA: Diagnosis not present

## 2019-07-22 NOTE — Patient Instructions (Signed)
Medication Instructions:  Your physician recommends that you continue on your current medications as directed. Please refer to the Current Medication list given to you today.  *If you need a refill on your cardiac medications before your next appointment, please call your pharmacy*   Lab Work: Lipid, Lft today If you have labs (blood work) drawn today and your tests are completely normal, you will receive your results only by: Marland Kitchen MyChart Message (if you have MyChart) OR . A paper copy in the mail If you have any lab test that is abnormal or we need to change your treatment, we will call you to review the results.   Testing/Procedures: None ordered   Follow-Up: At [facility], you and your health needs are our priority.  As part of our continuing mission to provide you with exceptional heart care, we have created designated Provider Care Teams.  These Care Teams include your primary Cardiologist (physician) and Advanced Practice Providers (APPs -  Physician Assistants and Nurse Practitioners) who all work together to provide you with the care you need, when you need it.  We recommend signing up for the patient portal called "MyChart".  Sign up information is provided on this After Visit Summary.  MyChart is used to connect with patients for Virtual Visits (Telemedicine).  Patients are able to view lab/test results, encounter notes, upcoming appointments, etc.  Non-urgent messages can be sent to your provider as well.   To learn more about what you can do with MyChart, go to [email or web address].    Your next appointment:   6 month(s)  The format for your next appointment:   In Person  Provider:    You may see Lorine Bears, MD or one of the following Advanced Practice Providers on your designated Care Team:    Nicolasa Ducking, NP  Eula Listen, PA-C  Marisue Ivan, PA-C    Other Instructions N/A

## 2019-07-22 NOTE — Progress Notes (Signed)
Cardiology Office Note   Date:  07/22/2019   ID:  Jackson Williamson, DOB [birth year], MRN [id number]  PCP:  Eustaquio Boyden, MD  Cardiologist:   Lorine Bears, MD   Chief Complaint  Patient presents with  . Other    2 month f/u no complaints today. Meds reviewed verbally with pt.      History of Present Illness: Jackson Williamson is a 64 y.o. male who presents for a follow-up visit regarding coronary artery disease. He has known history of hyperlipidemia and hypothyroidism.  He presented on March 1 with inferior ST elevation myocardial infarction.  Cardiac catheterization showed occluded distal right coronary artery with significant stenosis in the proximal vessel.  There was also chronic occlusion of OM1 and borderline significant disease in the mid left circumflex and mid LAD as well as distal LAD.  I performed successful angioplasty and drug-eluting stent placement to the distal and proximal right coronary artery.  OM1 was probed with 2 wires but could not be crossed indicating chronic occlusion.  Echocardiogram showed an EF of 55 to 60% with no wall motion abnormalities. He has been doing very well with no chest pain, shortness of breath or palpitations.  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  He has been taking his medications regularly with no reported side effects.   Past Medical History:  Diagnosis Date  . 3-vessel CAD    LHC 05/03/2019: Sig 3v CAD, dRCA 100% /pRCA 70% RCA --> p/dRCA with DES, OM1 99% probed, mLCx 60%, mLAD 70%, dLAD 90%, mild elv LVEDP,   . Diastolic dysfunction    3/1 echo EF 50-55%, G1DD  . ED (erectile dysfunction)   . GERD (gastroesophageal reflux disease)    related to generic levothyroxine  . History of chicken pox   . Hyperlipidemia   . Hypothyroidism     Past Surgical History:  Procedure Laterality Date  . CORONARY/GRAFT ACUTE MI REVASCULARIZATION N/A 05/03/2019   Procedure: Coronary/Graft Acute MI  Revascularization;  Surgeon: Iran Ouch, MD;  Location: [facility] INVASIVE CV LAB;  Service: Cardiovascular;  Laterality: N/A;  . DOBUTAMINE STRESS ECHO  2013   normal per patient  . HERNIA REPAIR Right 05/2012   RIH and umbilical  . KNEE SURGERY  [id number]   left knee  . LEFT HEART CATH AND CORONARY ANGIOGRAPHY N/A 05/03/2019   Procedure: LEFT HEART CATH AND CORONARY ANGIOGRAPHY;  Surgeon: Iran Ouch, MD;  Location: [facility] INVASIVE CV LAB;  Service: Cardiovascular;  Laterality: N/A;     Current Outpatient Medications  Medication Sig Dispense Refill  . aspirin 81 MG tablet Take 81 mg by mouth daily.     . Coenzyme Q10 (CO Q-10) 100 MG CAPS Take 1 capsule by mouth daily.  0  . Multiple Vitamin (MULTIVITAMIN WITH MINERALS) TABS tablet Take 1 tablet by mouth daily.    . prasugrel (EFFIENT) 10 MG TABS tablet Take 1 tablet (10 mg total) by mouth daily. 90 tablet 3  . rosuvastatin (CRESTOR) 40 MG tablet Take 1 tablet (40 mg total) by mouth daily at 6 PM. 90 tablet 3  . SYNTHROID 75 MCG tablet Take 1 tablet (75 mcg total) by mouth daily before breakfast. At 6am 90 tablet 2   No current facility-administered medications for this visit.    Allergies:   Levothyroxine    Social History:  The patient  reports that he has never smoked. He quit smokeless tobacco use  about 11 years ago.  His smokeless tobacco use included snuff. He reports that he does not drink alcohol or use drugs.   Family History:  The patient's family history includes CAD (age of onset: 84) in his brother; CAD (age of onset: 10) in his mother; CAD (age of onset: 57) in his father; Cancer (age of onset: 60) in his cousin; Cancer (age of onset: 73) in his maternal uncle; Cancer (age of onset: 25) in his maternal aunt; Cancer (age of onset: 35) in his maternal uncle; Heart attack in his maternal grandfather and mother; High Cholesterol in his sister; Hyperlipidemia in his brother, father, and mother; Hypertension in his  father; Stroke in his maternal grandfather.    ROS:  Please see the history of present illness.   Otherwise, review of systems are positive for none.   All other systems are reviewed and negative.    PHYSICAL EXAM: VS:  BP 134/90 (BP Location: Left Arm, Patient Position: Sitting, Cuff Size: Normal)   Pulse (!) 51   Ht 6\' 3"  (1.905 m)   Wt 223 lb 2 oz (101.2 kg)   SpO2 99%   BMI 27.89 kg/m  , BMI Body mass index is 27.89 kg/m. GEN: Well nourished, well developed, in no acute distress  HEENT: normal  Neck: no JVD, carotid bruits, or masses Cardiac: RRR; no murmurs, rubs, or gallops,no edema  Respiratory:  clear to auscultation bilaterally, normal work of breathing GI: soft, nontender, nondistended, + BS MS: no deformity or atrophy  Skin: warm and dry, no rash Neuro:  Strength and sensation are intact Psych: euthymic mood, full affect   EKG:  EKG is ordered today. The ekg ordered today demonstrates normal sinus rhythm with old inferoposterior infarct.   Recent Labs: 05/04/2019: ALT 30 05/13/2019: BUN 24; Creatinine, Ser 1.15; Hemoglobin 14.5; Platelets 340; Potassium 3.7; Sodium 136 06/03/2019: TSH 8.74    Lipid Panel    Component Value Date/Time   CHOL 238 (H) 05/04/2019 0426   CHOL 155 07/23/2012 0909   TRIG 127 05/04/2019 0426   TRIG 97 07/23/2012 0909   HDL 38 (L) 05/04/2019 0426   HDL 43 07/23/2012 0909   CHOLHDL 6.3 05/04/2019 0426   VLDL 25 05/04/2019 0426   LDLCALC 175 (H) 05/04/2019 0426   LDLCALC 93 07/23/2012 0909      Wt Readings from Last 3 Encounters:  07/22/19 223 lb 2 oz (101.2 kg)  06/03/19 225 lb 9 oz (102.3 kg)  05/13/19 230 lb (104.3 kg)        No flowsheet data found.    ASSESSMENT AND PLAN:  1.  Coronary artery disease involving native coronary arteries without angina: He is doing very well with no anginal symptoms.  Continue dual antiplatelet therapy at least until March 2022.  He does have residual disease in the LAD and left  circumflex but currently with no anginal symptoms. We discussed increasing his exercise and physical activities.  2.  Essential hypertension: Blood pressure is mildly elevated today but his blood pressure is usually in the normal range.  Continue to monitor for now.  3.  Hyperlipidemia: He has known history of severe hyperlipidemia with most recent LDL of 175.  Since then, he has been on rosuvastatin 40 mg daily.  I requested lipid and liver profile today.  If LDL is above 70, I recommend adding Zetia.  4.  Hypothyroidism: Currently on levothyroxine.    Disposition:   FU with me in 6 months  Signed,  Kathlyn [place], MD  07/22/2019 8:35 AM    [place]

## 2019-07-23 LAB — LIPID PANEL
Chol/HDL Ratio: 2.9 ratio (ref 0.0–5.0)
Cholesterol, Total: 131 mg/dL (ref 100–199)
HDL: 45 mg/dL (ref >39)
LDL Chol Calc (NIH): 71 mg/dL (ref 0–99)
Triglycerides: 75 mg/dL (ref 0–149)
VLDL Cholesterol Cal: 15 mg/dL (ref 5–40)

## 2019-07-23 LAB — HEPATIC FUNCTION PANEL
ALT: 24 IU/L (ref 0–44)
AST: 17 IU/L (ref 0–40)
Albumin: 4.2 g/dL (ref 3.8–4.8)
Alkaline Phosphatase: 64 IU/L (ref 48–121)
Bilirubin Total: 0.4 mg/dL (ref 0.0–1.2)
Bilirubin, Direct: 0.11 mg/dL (ref 0.00–0.40)
Total Protein: 6.3 g/dL (ref 6.0–8.5)

## 2019-08-05 NOTE — Addendum Note (Signed)
Addended by: Kendrick Fries on: 08/05/2019 07:54 AM   Modules accepted: Orders

## 2019-08-12 NOTE — Addendum Note (Signed)
Addended by: Iverson Alamin C on: 08/12/2019 11:11 AM   Modules accepted: Orders

## 2019-08-12 NOTE — Addendum Note (Signed)
Addended by: Margrett Rud on: 08/12/2019 03:28 PM   Modules accepted: Orders

## 2019-12-06 ENCOUNTER — Other Ambulatory Visit: Payer: Self-pay | Admitting: Family Medicine

## 2019-12-07 ENCOUNTER — Other Ambulatory Visit: Payer: 59

## 2019-12-09 ENCOUNTER — Encounter: Payer: 59 | Admitting: Family Medicine

## 2019-12-14 ENCOUNTER — Other Ambulatory Visit: Payer: Self-pay | Admitting: Family Medicine

## 2019-12-14 DIAGNOSIS — E782 Mixed hyperlipidemia: Secondary | ICD-10-CM

## 2019-12-14 DIAGNOSIS — Z1159 Encounter for screening for other viral diseases: Secondary | ICD-10-CM

## 2019-12-14 DIAGNOSIS — E039 Hypothyroidism, unspecified: Secondary | ICD-10-CM

## 2019-12-14 DIAGNOSIS — Z125 Encounter for screening for malignant neoplasm of prostate: Secondary | ICD-10-CM

## 2019-12-20 ENCOUNTER — Other Ambulatory Visit: Payer: Self-pay

## 2019-12-20 ENCOUNTER — Other Ambulatory Visit (INDEPENDENT_AMBULATORY_CARE_PROVIDER_SITE_OTHER): Payer: BC Managed Care – PPO

## 2019-12-20 DIAGNOSIS — E782 Mixed hyperlipidemia: Secondary | ICD-10-CM

## 2019-12-20 DIAGNOSIS — Z125 Encounter for screening for malignant neoplasm of prostate: Secondary | ICD-10-CM

## 2019-12-20 DIAGNOSIS — E039 Hypothyroidism, unspecified: Secondary | ICD-10-CM

## 2019-12-20 DIAGNOSIS — Z1159 Encounter for screening for other viral diseases: Secondary | ICD-10-CM

## 2019-12-20 LAB — COMPREHENSIVE METABOLIC PANEL
ALT: 25 U/L (ref 0–53)
AST: 14 U/L (ref 0–37)
Albumin: 4.2 g/dL (ref 3.5–5.2)
Alkaline Phosphatase: 53 U/L (ref 39–117)
BUN: 18 mg/dL (ref 6–23)
CO2: 25 mEq/L (ref 19–32)
Calcium: 9.3 mg/dL (ref 8.4–10.5)
Chloride: 106 mEq/L (ref 96–112)
Creatinine, Ser: 1.13 mg/dL (ref 0.40–1.50)
GFR: 69.3 mL/min (ref >60.00)
Glucose, Bld: 96 mg/dL (ref 70–99)
Potassium: 4.4 mEq/L (ref 3.5–5.1)
Sodium: 138 mEq/L (ref 135–145)
Total Bilirubin: 0.6 mg/dL (ref 0.2–1.2)
Total Protein: 6.4 g/dL (ref 6.0–8.3)

## 2019-12-20 LAB — LIPID PANEL
Cholesterol: 135 mg/dL (ref 0–200)
HDL: 42.7 mg/dL (ref >39.00)
LDL Cholesterol: 76 mg/dL (ref 0–99)
NonHDL: 92.24
Total CHOL/HDL Ratio: 3
Triglycerides: 83 mg/dL (ref 0.0–149.0)
VLDL: 16.6 mg/dL (ref 0.0–40.0)

## 2019-12-20 LAB — TSH: TSH: 8.65 u[IU]/mL — ABNORMAL HIGH (ref 0.35–4.50)

## 2019-12-20 LAB — T4, FREE: Free T4: 1.05 ng/dL (ref 0.60–1.60)

## 2019-12-20 LAB — PSA: PSA: 0.53 ng/mL (ref 0.10–4.00)

## 2019-12-21 LAB — HEPATITIS C ANTIBODY
Hepatitis C Ab: NONREACTIVE
SIGNAL TO CUT-OFF: 0.01 (ref <1.00)

## 2019-12-23 ENCOUNTER — Other Ambulatory Visit: Payer: Self-pay | Admitting: Family Medicine

## 2019-12-24 NOTE — Telephone Encounter (Signed)
Strength was increased to 75 mcg, per Dr. Reece Agar.  (see Labs, Result Notes, 06/03/19)

## 2019-12-28 ENCOUNTER — Ambulatory Visit (INDEPENDENT_AMBULATORY_CARE_PROVIDER_SITE_OTHER): Payer: BC Managed Care – PPO | Admitting: Family Medicine

## 2019-12-28 ENCOUNTER — Encounter: Payer: Self-pay | Admitting: Family Medicine

## 2019-12-28 ENCOUNTER — Other Ambulatory Visit: Payer: Self-pay

## 2019-12-28 ENCOUNTER — Other Ambulatory Visit (INDEPENDENT_AMBULATORY_CARE_PROVIDER_SITE_OTHER): Payer: BC Managed Care – PPO

## 2019-12-28 VITALS — BP 138/80 | HR 60 | Temp 98.4°F | Ht 74.5 in | Wt 227.2 lb

## 2019-12-28 DIAGNOSIS — Z1211 Encounter for screening for malignant neoplasm of colon: Secondary | ICD-10-CM | POA: Diagnosis not present

## 2019-12-28 DIAGNOSIS — I2510 Atherosclerotic heart disease of native coronary artery without angina pectoris: Secondary | ICD-10-CM

## 2019-12-28 DIAGNOSIS — E039 Hypothyroidism, unspecified: Secondary | ICD-10-CM

## 2019-12-28 DIAGNOSIS — R011 Cardiac murmur, unspecified: Secondary | ICD-10-CM

## 2019-12-28 DIAGNOSIS — Z0000 Encounter for general adult medical examination without abnormal findings: Secondary | ICD-10-CM | POA: Diagnosis not present

## 2019-12-28 DIAGNOSIS — R42 Dizziness and giddiness: Secondary | ICD-10-CM | POA: Insufficient documentation

## 2019-12-28 DIAGNOSIS — E782 Mixed hyperlipidemia: Secondary | ICD-10-CM | POA: Diagnosis not present

## 2019-12-28 LAB — FECAL OCCULT BLOOD, GUAIAC: Fecal Occult Blood: NEGATIVE

## 2019-12-28 LAB — FECAL OCCULT BLOOD, IMMUNOCHEMICAL: Fecal Occult Bld: NEGATIVE

## 2019-12-28 MED ORDER — SYNTHROID 100 MCG PO TABS: 100.0000 ug | ORAL_TABLET | Freq: Every day | ORAL | 3 refills | Status: DC

## 2019-12-28 NOTE — Assessment & Plan Note (Signed)
Not appreciated today.  

## 2019-12-28 NOTE — Progress Notes (Addendum)
This visit was conducted in person.  BP 138/80 (BP Location: Left Arm, Patient Position: Sitting, Cuff Size: Large)   Pulse 60   Temp 98.4 F (36.9 C) (Temporal)   Ht 6' 2.5" (1.892 m)   Wt 227 lb 3 oz (103.1 kg)   SpO2 98%   BMI 28.78 kg/m   Orthostatic VS for the past 24 hrs (Last 3 readings):  BP- Lying BP- Standing at 0 minutes  12/28/19 1033 -- 136/80  12/28/19 1031 136/82 --    CC: CPE Subjective:    Patient ID: Jackson Williamson, male    DOB: 12-Mar-1955, 64 y.o.   MRN: [id number]  HPI: Jackson Williamson is a 64 y.o. male presenting on 12/28/2019 for Annual Exam (C/o feeling lethargic after exercise. ) and Dizziness (C/o occasional dizziness.  Occurs after driving and goes to stand getting out of vehicle or while lifting weights.  Started about 2 mos ago. )   Inferior STEMI 05/2019 s/p cath and PCI with DESx2 to RCA continues effient, crestor, aspirin. Planning DAPT x12 months. Sees Dr Fletcher Anon. New LDL goal <70.   Notes occasional orthostatic dizziness especially when he gets up after prolonged period sitting - lightheaded. No syncope. Standing and resting improves symptoms. Also notes with squat/dead lift. Continues staying active at the gym - biking. Feels he stays hydrated well - 1 gallon water/day.   Hypothyroidism - continues 22mg daily brand synthroid (generic caused worsening GERD).   Preventative: Colon cancer screening - aunt with colon cancer at age 922 Stool kit brought in today.  Prostate cancer screening - checked yearly due to fmhx (uncle). No nocturia or BPH symptoms.  Flu shot - declines Tetanus - 2006. 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 Interim medical history since our last visit reviewed. Allergies and medications reviewed and updated. Outpatient Medications Prior to Visit  Medication Sig Dispense Refill  . aspirin 81 MG tablet Take 81 mg by mouth daily.     . Coenzyme Q10 (CO Q-10) 100 MG CAPS Take 1 capsule by mouth daily.  0  . prasugrel (EFFIENT) 10 MG TABS tablet Take 1 tablet (10 mg total) by mouth daily. 90 tablet 3  . rosuvastatin (CRESTOR) 40 MG tablet Take 1 tablet (40 mg total) by mouth daily at 6 PM. 90 tablet 3  . Multiple Vitamin (MULTIVITAMIN WITH MINERALS) TABS tablet Take 1 tablet by mouth daily.    .Marland KitchenSYNTHROID 75 MCG tablet Take 1 tablet (75 mcg total) by mouth daily before breakfast. At 6am 90 tablet 2   No facility-administered medications prior to visit.     Per HPI unless specifically indicated in ROS section below Review of Systems  Constitutional: Negative for activity change, appetite change, chills, fatigue, fever and unexpected weight change.  HENT: Negative for hearing loss.   Eyes: Negative for visual disturbance.  Respiratory: Negative for cough, chest tightness, shortness of breath and wheezing.   Cardiovascular: Negative for chest pain, palpitations and leg swelling.  Gastrointestinal: Negative for abdominal distention, abdominal pain, blood in stool,  constipation, diarrhea, nausea and vomiting.  Genitourinary: Negative for difficulty urinating and hematuria.  Musculoskeletal: Negative for arthralgias, myalgias and neck pain.  Skin: Negative for rash.  Neurological: Positive for dizziness. Negative for seizures, syncope and headaches.  Hematological: Negative for adenopathy. Does not bruise/bleed easily.  Psychiatric/Behavioral: Negative for dysphoric mood. The patient is not nervous/anxious.    Objective:  BP 138/80 (BP Location: Left Arm, Patient Position: Sitting, Cuff  Size: Large)   Pulse 60   Temp 98.4 F (36.9 C) (Temporal)   Ht 6' 2.5" (1.892 m)   Wt 227 lb 3 oz (103.1 kg)   SpO2 98%   BMI 28.78 kg/m   Wt Readings from Last 3 Encounters:  12/28/19 227 lb 3 oz (103.1 kg)  07/22/19 223 lb 2 oz (101.2 kg)  06/03/19 225 lb 9 oz (102.3 kg)      Physical Exam Vitals and nursing note reviewed.  Constitutional:      General: He is not in acute distress.    Appearance: Normal appearance. He is well-developed. He is not ill-appearing.  HENT:     Head: Normocephalic and atraumatic.     Right Ear: Hearing, tympanic membrane, ear canal and external ear normal.     Left Ear: Hearing, tympanic membrane, ear canal and external ear normal.  Eyes:     General: No scleral icterus.    Extraocular Movements: Extraocular movements intact.     Conjunctiva/sclera: Conjunctivae normal.     Pupils: Pupils are equal, round, and reactive to light.  Neck:     Thyroid: No thyroid mass or thyromegaly.     Vascular: No carotid bruit.  Cardiovascular:     Rate and Rhythm: Normal rate and regular rhythm.     Pulses: Normal pulses.          Radial pulses are 2+ on the right side and 2+ on the left side.     Heart sounds: Normal heart sounds. No murmur heard.   Pulmonary:     Effort: Pulmonary effort is normal. No respiratory distress.     Breath sounds: Normal breath sounds. No wheezing, rhonchi or rales.  Abdominal:     General: Abdomen is flat. Bowel sounds are normal. There is no distension.     Palpations: Abdomen is soft. There is no mass.     Tenderness: There is no abdominal tenderness. There is no guarding or rebound.     Hernia: No hernia is present.  Musculoskeletal:        General: Normal range of motion.     Cervical back: Normal range of motion and neck supple.     Right lower leg: No edema.     Left lower leg: No edema.  Lymphadenopathy:     Cervical: No cervical adenopathy.  Skin:    General: Skin is warm and dry.     Findings: No rash.    Neurological:     General: No focal deficit present.     Mental Status: He is alert and oriented to person, place, and time.     Comments: CN grossly intact, station and gait intact  Psychiatric:        Mood and Affect: Mood normal.        Behavior: Behavior normal.        Thought Content: Thought content normal.        Judgment: Judgment normal.       Results for orders placed or performed in visit on 12/20/19  Hepatitis  C antibody  Result Value Ref Range   Hepatitis C Ab NON-REACTIVE NON-REACTI   SIGNAL TO CUT-OFF 0.01 <1.00  PSA  Result Value Ref Range   PSA 0.53 0.10 - 4.00 ng/mL  TSH  Result Value Ref Range   TSH 8.65 (H) 0.35 - 4.50 uIU/mL  T4, free  Result Value Ref Range   Free T4 1.05 0.60 - 1.60 ng/dL  Comprehensive metabolic panel  Result Value Ref Range   Sodium 138 135 - 145 mEq/L   Potassium 4.4 3.5 - 5.1 mEq/L   Chloride 106 96 - 112 mEq/L   CO2 25 19 - 32 mEq/L   Glucose, Bld 96 70 - 99 mg/dL   BUN 18 6 - 23 mg/dL   Creatinine, Ser 1.13 0.40 - 1.50 mg/dL   Total Bilirubin 0.6 0.2 - 1.2 mg/dL   Alkaline Phosphatase 53 39 - 117 U/L   AST 14 0 - 37 U/L   ALT 25 0 - 53 U/L   Total Protein 6.4 6.0 - 8.3 g/dL   Albumin 4.2 3.5 - 5.2 g/dL   GFR 69.30 >60.00 mL/min   Calcium 9.3 8.4 - 10.5 mg/dL  Lipid panel  Result Value Ref Range   Cholesterol 135 0 - 200 mg/dL   Triglycerides 83.0 0 - 149 mg/dL   HDL 42.70 >39.00 mg/dL   VLDL 16.6 0.0 - 40.0 mg/dL   LDL Cholesterol 76 0 - 99 mg/dL   Total CHOL/HDL Ratio 3    NonHDL 92.24    Lab Results  Component Value Date   WBC 7.8 05/13/2019   HGB 14.5 05/13/2019   HCT 43.0 05/13/2019   MCV 89.4 05/13/2019   PLT 340 05/13/2019    Assessment & Plan:  This visit occurred during the SARS-CoV-2 public health emergency.  Safety protocols were in place, including screening questions prior to the visit, additional usage of staff PPE, and extensive cleaning of exam room while observing appropriate contact time as  indicated for disinfecting solutions.   Problem List Items Addressed This Visit    Systolic murmur    Not appreciated today.       Hypothyroidism    TSH remains low and pt endorses ongoing easy fatiguability along with trouble losing weight.  Increase thyroid dose to 175mg daily, reassess levels in 2 months.       Relevant Medications   SYNTHROID 100 MCG tablet   Other Relevant Orders   TSH   T4, free   Hyperlipidemia    Chronic, stable on high intensity crestor. Continue.  The ASCVD Risk Williamson (Mikey BussingDC Jr., et al., 2013) failed to calculate for the following reasons:   The patient has a prior MI or stroke diagnosis       Healthcare maintenance - Primary    Preventative protocols reviewed and updated unless pt declined. Discussed healthy diet and lifestyle.  iFOB submitted today.       Dizziness    Endorses mild orthostatic dizziness, normal vital signs today.  Good hydration status. Not on antihypertensive.  Will optimize thyroid replacement and reassess. Offered CBC - pt declines but will let me know if ongoing to return for check.       Relevant Orders   CBC with Differential/Platelet   CAD (coronary artery disease)    Continue DAPT x 1 year. Appreciate cards care.          Meds ordered this encounter  Medications  . SYNTHROID 100 MCG tablet    Sig:  Take 1 tablet (100 mcg total) by mouth daily before breakfast.    Dispense:  90 tablet    Refill:  3   Orders Placed This Encounter  Procedures  . CBC with Differential/Platelet    Standing Status:   Future    Standing Expiration Date:   12/27/2020  . TSH    Standing Status:   Future    Standing Expiration Date:   12/27/2020  . T4, free    Standing Status:   Future    Standing Expiration Date:   12/27/2020    Patient instructions: Return in 2-3 months for thyroid recheck (lab visit only) Let me know if ongoing dizziness for lab visit to check blood counts.  Continue current medicines, keep follow up  with cardiology.   Follow up plan: Return in about 1 year (around 12/27/2020) for annual exam, prior fasting for blood work.  Ria Bush, MD

## 2019-12-28 NOTE — Assessment & Plan Note (Signed)
Continue DAPT x 1 year. Appreciate cards care.

## 2019-12-28 NOTE — Patient Instructions (Addendum)
Return in 2-3 months for thyroid recheck (lab visit only) Let me know if ongoing dizziness for lab visit to check blood counts.  Continue current medicines, keep follow up with cardiology.   Health Maintenance, Male Adopting a healthy lifestyle and getting preventive care are important in promoting health and wellness. Ask your health care provider about:  The right schedule for you to have regular tests and exams.  Things you can do on your own to prevent diseases and keep yourself healthy. What should I know about diet, weight, and exercise? Eat a healthy diet   Eat a diet that includes plenty of vegetables, fruits, low-fat dairy products, and lean protein.  Do not eat a lot of foods that are high in solid fats, added sugars, or sodium. Maintain a healthy weight Body mass index (BMI) is a measurement that can be used to identify possible weight problems. It estimates body fat based on height and weight. Your health care provider can help determine your BMI and help you achieve or maintain a healthy weight. Get regular exercise Get regular exercise. This is one of the most important things you can do for your health. Most adults should:  Exercise for at least 150 minutes each week. The exercise should increase your heart rate and make you sweat (moderate-intensity exercise).  Do strengthening exercises at least twice a week. This is in addition to the moderate-intensity exercise.  Spend less time sitting. Even light physical activity can be beneficial. Watch cholesterol and blood lipids Have your blood tested for lipids and cholesterol at 62 years of age, then have this test every 5 years. You may need to have your cholesterol levels checked more often if:  Your lipid or cholesterol levels are high.  You are older than 62 years of age.  You are at high risk for heart disease. What should I know about cancer screening? Many types of cancers can be detected early and may often be  prevented. Depending on your health history and family history, you may need to have cancer screening at various ages. This may include screening for:  Colorectal cancer.  Prostate cancer.  Skin cancer.  Lung cancer. What should I know about heart disease, diabetes, and high blood pressure? Blood pressure and heart disease  High blood pressure causes heart disease and increases the risk of stroke. This is more likely to develop in people who have high blood pressure readings, are of African descent, or are overweight.  Talk with your health care provider about your target blood pressure readings.  Have your blood pressure checked: ? Every 3-5 years if you are 69-66 years of age. ? Every year if you are 36 years old or older.  If you are between the ages of 45 and 60 and are a current or former smoker, ask your health care provider if you should have a one-time screening for abdominal aortic aneurysm (AAA). Diabetes Have regular diabetes screenings. This checks your fasting blood sugar level. Have the screening done:  Once every three years after age 96 if you are at a normal weight and have a low risk for diabetes.  More often and at a younger age if you are overweight or have a high risk for diabetes. What should I know about preventing infection? Hepatitis B If you have a higher risk for hepatitis B, you should be screened for this virus. Talk with your health care provider to find out if you are at risk for hepatitis B infection.  Hepatitis C Blood testing is recommended for:  Everyone born from [birth year] through 1963.  Anyone with known risk factors for hepatitis C. Sexually transmitted infections (STIs)  You should be screened each year for STIs, including gonorrhea and chlamydia, if: ? You are sexually active and are younger than 62 years of age. ? You are older than 62 years of age and your health care provider tells you that you are at risk for this type of  infection. ? Your sexual activity has changed since you were last screened, and you are at increased risk for chlamydia or gonorrhea. Ask your health care provider if you are at risk.  Ask your health care provider about whether you are at high risk for HIV. Your health care provider may recommend a prescription medicine to help prevent HIV infection. If you choose to take medicine to prevent HIV, you should first get tested for HIV. You should then be tested every 3 months for as long as you are taking the medicine. Follow these instructions at home: Lifestyle  Do not use any products that contain nicotine or tobacco, such as cigarettes, e-cigarettes, and chewing tobacco. If you need help quitting, ask your health care provider.  Do not use street drugs.  Do not share needles.  Ask your health care provider for help if you need support or information about quitting drugs. Alcohol use  Do not drink alcohol if your health care provider tells you not to drink.  If you drink alcohol: ? Limit how much you have to 0-2 drinks a day. ? Be aware of how much alcohol is in your drink. In the [place] one drink equals one 12 oz bottle of beer (355 mL), one 5 oz glass of wine (148 mL), or one 1 oz glass of hard liquor (44 mL). General instructions  Schedule regular health, dental, and eye exams.  Stay current with your vaccines.  Tell your health care provider if: ? You often feel depressed. ? You have ever been abused or do not feel safe at home. Summary  Adopting a healthy lifestyle and getting preventive care are important in promoting health and wellness.  Follow your health care provider's instructions about healthy diet, exercising, and getting tested or screened for diseases.  Follow your health care provider's instructions on monitoring your cholesterol and blood pressure. This information is not intended to replace advice given to you by your health care provider. Make sure you  discuss any questions you have with your health care provider. Document Revised: 02/11/2018 Document Reviewed: 02/11/2018 Elsevier Patient Education  2020 [employer].

## 2019-12-28 NOTE — Assessment & Plan Note (Signed)
Endorses mild orthostatic dizziness, normal vital signs today.  Good hydration status. Not on antihypertensive.  Will optimize thyroid replacement and reassess. Offered CBC - pt declines but will let me know if ongoing to return for check.

## 2019-12-28 NOTE — Assessment & Plan Note (Addendum)
Preventative protocols reviewed and updated unless pt declined. Discussed healthy diet and lifestyle.  iFOB submitted today.

## 2019-12-28 NOTE — Assessment & Plan Note (Signed)
Chronic, stable on high intensity crestor. Continue.  The ASCVD Risk score Jackson George DC Jr., Jackson al., 2013) failed to calculate for the following reasons:   The patient has a prior MI or stroke diagnosis

## 2019-12-28 NOTE — Addendum Note (Signed)
Addended by: Eustaquio Boyden on: 12/28/2019 11:15 AM   Modules accepted: Orders

## 2019-12-28 NOTE — Assessment & Plan Note (Addendum)
TSH remains low and pt endorses ongoing easy fatiguability along with trouble losing weight.  Increase thyroid dose to daily, reassess levels in 2 months.

## 2019-12-29 ENCOUNTER — Encounter: Payer: Self-pay | Admitting: Family Medicine

## 2020-02-10 ENCOUNTER — Ambulatory Visit (INDEPENDENT_AMBULATORY_CARE_PROVIDER_SITE_OTHER): Payer: BC Managed Care – PPO | Admitting: Cardiovascular Disease

## 2020-02-10 ENCOUNTER — Other Ambulatory Visit: Payer: Self-pay

## 2020-02-10 ENCOUNTER — Encounter: Payer: Self-pay | Admitting: Cardiovascular Disease

## 2020-02-10 VITALS — BP 128/94 | HR 59 | Ht 75.0 in | Wt 230.0 lb

## 2020-02-10 DIAGNOSIS — I10 Essential (primary) hypertension: Secondary | ICD-10-CM | POA: Diagnosis not present

## 2020-02-10 DIAGNOSIS — E039 Hypothyroidism, unspecified: Secondary | ICD-10-CM | POA: Diagnosis not present

## 2020-02-10 DIAGNOSIS — I2510 Atherosclerotic heart disease of native coronary artery without angina pectoris: Secondary | ICD-10-CM

## 2020-02-10 DIAGNOSIS — E785 Hyperlipidemia, unspecified: Secondary | ICD-10-CM | POA: Diagnosis not present

## 2020-02-10 NOTE — Patient Instructions (Signed)

## 2020-02-10 NOTE — Progress Notes (Signed)
Cardiology Office Note   Date:  02/10/2020   ID:  Jackson Williamson, DOB Mar 15, 1955, MRN [id number]  PCP:  Eustaquio Boyden, MD  Cardiologist:   Lorine Bears, MD   Chief Complaint  Patient presents with  . Other    6 month f/u. Meds reviewed verbally with pt.      History of Present Illness: Jackson Williamson is a 64 y.o. male who presents for a follow-up visit regarding coronary artery disease. He has known history of hyperlipidemia and hypothyroidism.  He presented in March 2021 with inferior ST elevation myocardial infarction.  Cardiac catheterization showed occluded distal right coronary artery with significant stenosis in the proximal vessel.  There was also chronic occlusion of OM1 and borderline significant disease in the mid left circumflex and mid LAD as well as distal LAD.  I performed successful angioplasty and drug-eluting stent placement to the distal and proximal right coronary artery.  OM1 was probed with 2 wires but could not be crossed indicating chronic occlusion.  Echocardiogram showed an EF of 50-55% with no wall motion abnormalities.  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    He has been doing very well with no recent chest pain, shortness of breath or palpitations.  He takes his medications regularly.  Past Medical History:  Diagnosis Date  . 3-vessel CAD    LHC 05/03/2019: Sig 3v CAD, dRCA 100% /pRCA 70% RCA --> p/dRCA with DES, OM1 99% probed, mLCx 60%, mLAD 70%, dLAD 90%, mild elv LVEDP,   . Diastolic dysfunction    3/1 echo EF 50-55%, G1DD  . ED (erectile dysfunction)   . GERD (gastroesophageal reflux disease)    related to generic levothyroxine  . History of chicken pox   . Hyperlipidemia   . Hypothyroidism     Past Surgical History:  Procedure Laterality Date  . CORONARY/GRAFT ACUTE MI REVASCULARIZATION N/A 05/03/2019   Procedure: Coronary/Graft Acute MI Revascularization;  Surgeon: Iran Ouch, MD;   Location: [facility] INVASIVE CV LAB;  Service: Cardiovascular;  Laterality: N/A;  . DOBUTAMINE STRESS ECHO  2013   normal per patient  . HERNIA REPAIR Right 05/2012   RIH and umbilical  . KNEE SURGERY  [phone]   left knee  . LEFT HEART CATH AND CORONARY ANGIOGRAPHY N/A 05/03/2019   Procedure: LEFT HEART CATH AND CORONARY ANGIOGRAPHY;  Surgeon: Iran Ouch, MD;  Location: [facility] INVASIVE CV LAB;  Service: Cardiovascular;  Laterality: N/A;     Current Outpatient Medications  Medication Sig Dispense Refill  . aspirin 81 MG tablet Take 81 mg by mouth daily.     . Coenzyme Q10 (CO Q-10) 100 MG CAPS Take 1 capsule by mouth daily.  0  . prasugrel (EFFIENT) 10 MG TABS tablet Take 1 tablet (10 mg total) by mouth daily. 90 tablet 3  . rosuvastatin (CRESTOR) 40 MG tablet Take 1 tablet (40 mg total) by mouth daily at 6 PM. 90 tablet 3  . SYNTHROID 100 MCG tablet Take 1 tablet (100 mcg total) by mouth daily before breakfast. 90 tablet 3   No current facility-administered medications for this visit.    Allergies:   Levothyroxine    Social History:  The patient  reports that he has never smoked. He quit smokeless tobacco use about 11 years ago.  His smokeless tobacco use included snuff. He reports that he does not drink alcohol and does not use drugs.   Family History:  The patient's family history includes CAD (age of onset: 17) in his brother; CAD (age of onset: 18) in his mother; CAD (age of onset: 38) in his father; Cancer (age of onset: 69) in his cousin; Cancer (age of onset: 10) in his maternal uncle; Cancer (age of onset: 6) in his maternal aunt; Cancer (age of onset: 86) in his maternal uncle; Heart attack in his maternal grandfather and mother; High Cholesterol in his sister; Hyperlipidemia in his brother, father, and mother; Hypertension in his father; Stroke in his maternal grandfather.    ROS:  Please see the history of present illness.   Otherwise, review of systems are positive for  none.   All other systems are reviewed and negative.    PHYSICAL EXAM: VS:  BP (!) 128/94 (BP Location: Left Arm, Patient Position: Sitting, Cuff Size: Normal)   Pulse (!) 59   Ht 6\' 3"  (1.905 m)   Wt 230 lb (104.3 kg)   SpO2 96%   BMI 28.75 kg/m  , BMI Body mass index is 28.75 kg/m. GEN: Well nourished, well developed, in no acute distress  HEENT: normal  Neck: no JVD, carotid bruits, or masses Cardiac: RRR; no murmurs, rubs, or gallops,no edema  Respiratory:  clear to auscultation bilaterally, normal work of breathing GI: soft, nontender, nondistended, + BS MS: no deformity or atrophy  Skin: warm and dry, no rash Neuro:  Strength and sensation are intact Psych: euthymic mood, full affect   EKG:  EKG is ordered today. The ekg ordered today demonstrates sinus bradycardia with old posterior infarct.   Recent Labs: 05/13/2019: Hemoglobin 14.5; Platelets 340 12/20/2019: ALT 25; BUN 18; Creatinine, Ser 1.13; Potassium 4.4; Sodium 138; TSH 8.65    Lipid Panel    Component Value Date/Time   CHOL 135 12/20/2019 0902   CHOL 131 07/22/2019 0851   CHOL 155 07/23/2012 0909   TRIG 83.0 12/20/2019 0902   TRIG 97 07/23/2012 0909   HDL 42.70 12/20/2019 0902   HDL 45 07/22/2019 0851   HDL 43 07/23/2012 0909   CHOLHDL 3 12/20/2019 0902   VLDL 16.6 12/20/2019 0902   LDLCALC 76 12/20/2019 0902   LDLCALC 71 07/22/2019 0851   LDLCALC 93 07/23/2012 0909      Wt Readings from Last 3 Encounters:  02/10/20 230 lb (104.3 kg)  12/28/19 227 lb 3 oz (103.1 kg)  07/22/19 223 lb 2 oz (101.2 kg)        No flowsheet data found.    ASSESSMENT AND PLAN:  1.  Coronary artery disease involving native coronary arteries without angina: He is doing very well with no anginal symptoms.  Continue dual antiplatelet therapy at least until March 2022.  Given his residual disease and well tolerance to dual antiplatelet therapy, I am going to continue for now long-term.  2.  Essential  hypertension: Mildly elevated diastolic blood pressure but systolic is normal.  Continue to monitor  3.  Hyperlipidemia: Significant improvement in hyperlipidemia with a drop in LDL from 175 to 75 on rosuvastatin 40 mg daily.  I discussed the option of a target of less than 70 and may be even less than 55.  I suggested adding Zetia but he wants to try to increase his physical activities and improve his diet before adding another medication which I think is reasonable.  4.  Hypothyroidism: Currently on levothyroxine.    Disposition:   FU with me in 6 months  Signed,  April 2022, MD  02/10/2020 12:53 PM  [facility]

## 2020-02-14 NOTE — Addendum Note (Signed)
Addended by: Kendrick Fries on: 02/14/2020 07:25 AM   Modules accepted: Orders

## 2020-03-16 ENCOUNTER — Other Ambulatory Visit: Payer: 59

## 2020-03-16 ENCOUNTER — Other Ambulatory Visit: Payer: Self-pay | Admitting: Family Medicine

## 2020-03-16 ENCOUNTER — Other Ambulatory Visit: Payer: Self-pay

## 2020-03-16 ENCOUNTER — Telehealth (INDEPENDENT_AMBULATORY_CARE_PROVIDER_SITE_OTHER): Payer: 59 | Admitting: Family Medicine

## 2020-03-16 ENCOUNTER — Encounter: Payer: Self-pay | Admitting: Family Medicine

## 2020-03-16 VITALS — Ht 74.5 in

## 2020-03-16 DIAGNOSIS — R0981 Nasal congestion: Secondary | ICD-10-CM

## 2020-03-16 DIAGNOSIS — R059 Cough, unspecified: Secondary | ICD-10-CM | POA: Diagnosis not present

## 2020-03-16 DIAGNOSIS — Z20822 Contact with and (suspected) exposure to covid-19: Secondary | ICD-10-CM | POA: Diagnosis not present

## 2020-03-16 MED ORDER — DOXYCYCLINE HYCLATE 100 MG PO TABS: 100.0000 mg | ORAL_TABLET | Freq: Two times a day (BID) | ORAL | 0 refills | Status: AC

## 2020-03-16 NOTE — Progress Notes (Signed)
Appointment scheduled today at 3:30 pm for Covid testing.  Back door information provided.

## 2020-03-16 NOTE — Progress Notes (Signed)
Demaria Deeney T. Latrell Reitan, MD Primary Care and Sports Medicine [facility] at [facility] [address] [place], [address] Phone: [phone]  FAX: [phone]  Jackson Williamson - 65 y.o. male  MRN [id number]  Date of Birth: August 30, 1955  Visit Date: 03/16/2020  PCP: Eustaquio Boyden, MD  Referred by: Eustaquio Boyden, MD  Virtual Visit via Video Note:  I connected with  [name] on 03/16/2020 11:20 AM EST by a video enabled telemedicine application and verified that I am speaking with the correct person using two identifiers.   Location patient: home computer, tablet, or smartphone Location provider: work or home office Consent: Verbal consent directly obtained from [name]. Persons participating in the virtual visit: patient, provider  I discussed the limitations of evaluation and management by telemedicine and the availability of in person appointments. The patient expressed understanding and agreed to proceed.  Chief Complaint  Patient presents with  . Nasal Congestion  . Cough  . Fatigue    History of Present Illness:  Dec .26 he started to feel sick.  Sometimes will have a runny nose, some fatigue, a lot of sneezing and some mucous in his throat.  Does not feel awful.  Very fatigued.  He has had some waxing and waning symptoms.  Taking Amox - not helping. Went to her oldest daughter's place - she had Covid. Works in [employer].  No vaccine.   No history of smoking.  Occ intermittent fever, but this is waxed and waned.  Eyelids will flutter.  Works from home. Paperwork.  He denies other symptoms.  Feels like getting better.   Check for covid.  Immunization History  Administered Date(s) Administered  . Td 03/04/2004     Review of Systems as above: See pertinent positives and pertinent negatives per HPI No acute distress verbally   Observations/Objective/Exam:  An attempt was made to discern vital signs over the phone and  per patient if applicable and possible.   General:    Alert, Oriented, appears well and in no acute distress  Pulmonary:     On inspection no signs of respiratory distress.  Psych / Neurological:     Pleasant and cooperative.  Assessment and Plan:    ICD-10-CM   1. Suspected COVID-19 virus infection  Z20.822   2. Cough  R05.9   3. Nasal congestion  R09.81    Prolonged cough, etiology unclear.  Certainly could have had COVID-19 with known COVID exposure by his daughter.  Check for COVID today.  Additional agents would also certainly be possible.  Atypical pneumonia certainly possibility, focal pneumonia also possible.  No examination possible.  I am going to cover him with some doxycycline.  I discussed the assessment and treatment plan with the patient. The patient was provided an opportunity to ask questions and all were answered. The patient agreed with the plan and demonstrated an understanding of the instructions.   The patient was advised to call back or seek an in-person evaluation if the symptoms worsen or if the condition fails to improve as anticipated.  Follow-up: prn unless noted otherwise below No follow-ups on file.  Meds ordered this encounter  Medications  . doxycycline (VIBRA-TABS) 100 MG tablet    Sig: Take 1 tablet (100 mg total) by mouth 2 (two) times daily for 10 days.    Dispense:  20 tablet    Refill:  0   No orders of the defined types were placed  in this encounter.   Signed,  Elpidio Galea. Barb Shear, MD

## 2020-03-16 NOTE — Progress Notes (Signed)
covid test

## 2020-03-19 LAB — NOVEL CORONAVIRUS, NAA: SARS-CoV-2, NAA: NOT DETECTED

## 2020-03-19 LAB — SPECIMEN STATUS REPORT

## 2020-03-19 LAB — SARS-COV-2, NAA 2 DAY TAT

## 2020-03-30 ENCOUNTER — Other Ambulatory Visit: Payer: BC Managed Care – PPO

## 2020-07-24 ENCOUNTER — Encounter: Payer: Self-pay | Admitting: Family Medicine

## 2020-07-24 MED ORDER — ROSUVASTATIN CALCIUM 40 MG PO TABS: 40.0000 mg | ORAL_TABLET | Freq: Every day | ORAL | 1 refills | Status: DC

## 2020-07-24 NOTE — Telephone Encounter (Signed)
E-scribed refill 

## 2020-08-17 ENCOUNTER — Other Ambulatory Visit: Payer: Self-pay

## 2020-08-17 ENCOUNTER — Encounter: Payer: Self-pay | Admitting: Cardiovascular Disease

## 2020-08-17 ENCOUNTER — Ambulatory Visit (INDEPENDENT_AMBULATORY_CARE_PROVIDER_SITE_OTHER): Payer: 59 | Admitting: Cardiovascular Disease

## 2020-08-17 VITALS — BP 140/100 | HR 54 | Ht 75.0 in | Wt 239.1 lb

## 2020-08-17 DIAGNOSIS — I10 Essential (primary) hypertension: Secondary | ICD-10-CM

## 2020-08-17 DIAGNOSIS — E782 Mixed hyperlipidemia: Secondary | ICD-10-CM

## 2020-08-17 DIAGNOSIS — E785 Hyperlipidemia, unspecified: Secondary | ICD-10-CM

## 2020-08-17 DIAGNOSIS — I2510 Atherosclerotic heart disease of native coronary artery without angina pectoris: Secondary | ICD-10-CM | POA: Diagnosis not present

## 2020-08-17 DIAGNOSIS — E039 Hypothyroidism, unspecified: Secondary | ICD-10-CM

## 2020-08-17 MED ORDER — AMLODIPINE BESYLATE 5 MG PO TABS: 5.0000 mg | ORAL_TABLET | Freq: Every day | ORAL | 3 refills | Status: DC

## 2020-08-17 NOTE — Patient Instructions (Signed)
Medication Instructions:  START Amlodipine 5 mg Daily  STOP Prasugrel  *If you need a refill on your cardiac medications before your next appointment, please call your pharmacy*  Lab Work: None  Testing/Procedures: None  Follow-Up: At [facility], you and your health needs are our priority.  As part of our continuing mission to provide you with exceptional heart care, we have created designated Provider Care Teams.  These Care Teams include your primary Cardiologist (physician) and Advanced Practice Providers (APPs -  Physician Assistants and Nurse Practitioners) who all work together to provide you with the care you need, when you need it.  Your next appointment:   6 month(s)  The format for your next appointment:   In Person  Provider:   You may see Lorine Bears, MD or one of the following Advanced Practice Providers on your designated Care Team:   Nicolasa Ducking, NP Eula Listen, PA-C Marisue Ivan, PA-C Cadence [place], [place]

## 2020-08-17 NOTE — Progress Notes (Signed)
Cardiology Office Note   Date:  08/17/2020   ID:  Jackson Williamson, DOB [birth year], MRN [id number]  PCP:  Jackson Boyden, MD  Cardiologist:   Jackson Bears, MD   Chief Complaint  Patient presents with   Other    6 month f/u no complaints today. Meds reviewed verbally with pt.       History of Present Illness: Jackson Williamson is a 65 y.o. male who presents for a follow-up visit regarding coronary artery disease. He has known history of hyperlipidemia and hypothyroidism.  He presented in March 2021 with inferior ST elevation myocardial infarction.  Cardiac catheterization showed occluded distal right coronary artery with significant stenosis in the proximal vessel.  There was also chronic occlusion of OM1 and borderline significant disease in the mid left circumflex and mid LAD as well as distal LAD.  I performed successful angioplasty and drug-eluting stent placement to the distal and proximal right coronary artery.  OM1 was probed with 2 wires but could not be crossed indicating chronic occlusion.  Echocardiogram showed an EF of 50-55% with no wall motion abnormalities.  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    He had COVID-19 infection twice since last visit.  The first 1 was in January and the second was recently.  He has been feeling tired and fatigued since then and he gained some weight.  No chest pain or shortness of breath.  Past Medical History:  Diagnosis Date   3-vessel CAD    LHC 05/03/2019: Sig 3v CAD, dRCA 100% /pRCA 70% RCA --> p/dRCA with DES, OM1 99% probed, mLCx 60%, mLAD 70%, dLAD 90%, mild elv LVEDP,    Diastolic dysfunction    3/1 echo EF 50-55%, G1DD   ED (erectile dysfunction)    GERD (gastroesophageal reflux disease)    related to generic levothyroxine   History of chicken pox    Hyperlipidemia    Hypothyroidism     Past Surgical History:  Procedure Laterality Date   CORONARY/GRAFT ACUTE MI REVASCULARIZATION  N/A 05/03/2019   Procedure: Coronary/Graft Acute MI Revascularization;  Surgeon: Iran Ouch, MD;  Location: [facility] INVASIVE CV LAB;  Service: Cardiovascular;  Laterality: N/A;   DOBUTAMINE STRESS ECHO  2013   normal per patient   HERNIA REPAIR Right 05/2012   RIH and umbilical   KNEE SURGERY  [phone]   left knee   LEFT HEART CATH AND CORONARY ANGIOGRAPHY N/A 05/03/2019   Procedure: LEFT HEART CATH AND CORONARY ANGIOGRAPHY;  Surgeon: Iran Ouch, MD;  Location: [facility] INVASIVE CV LAB;  Service: Cardiovascular;  Laterality: N/A;     Current Outpatient Medications  Medication Sig Dispense Refill   amLODipine (NORVASC) 5 MG tablet Take 1 tablet (5 mg total) by mouth daily. 180 tablet 3   aspirin 81 MG tablet Take 81 mg by mouth daily.      Coenzyme Q10 (CO Q-10) 100 MG CAPS Take 1 capsule by mouth daily.  0   rosuvastatin (CRESTOR) 40 MG tablet Take 1 tablet (40 mg total) by mouth daily at 6 PM. 90 tablet 1   SYNTHROID 100 MCG tablet Take 1 tablet (100 mcg total) by mouth daily before breakfast. 90 tablet 3   No current facility-administered medications for this visit.    Allergies:   Levothyroxine    Social History:  The patient  reports that he has never smoked. He quit smokeless tobacco use about 12 years ago.  His smokeless tobacco use included snuff. He reports that he does not drink alcohol and does not use drugs.   Family History:  The patient's family history includes CAD (age of onset: 48) in his brother; CAD (age of onset: 10) in his mother; CAD (age of onset: 24) in his father; Cancer (age of onset: 57) in his cousin; Cancer (age of onset: 71) in his maternal uncle; Cancer (age of onset: 32) in his maternal aunt; Cancer (age of onset: 52) in his maternal uncle; Heart attack in his maternal grandfather and mother; High Cholesterol in his sister; Hyperlipidemia in his brother, father, and mother; Hypertension in his father; Stroke in his maternal grandfather.    ROS:   Please see the history of present illness.   Otherwise, review of systems are positive for none.   All other systems are reviewed and negative.    PHYSICAL EXAM: VS:  BP (!) 140/100 (BP Location: Left Arm, Patient Position: Sitting, Cuff Size: Normal)   Pulse (!) 54   Ht 6\' 3"  (1.905 m)   Wt 239 lb 2 oz (108.5 kg)   SpO2 98%   BMI 29.89 kg/m  , BMI Body mass index is 29.89 kg/m. GEN: Well nourished, well developed, in no acute distress  HEENT: normal  Neck: no JVD, carotid bruits, or masses Cardiac: RRR; no murmurs, rubs, or gallops,no edema  Respiratory:  clear to auscultation bilaterally, normal work of breathing GI: soft, nontender, nondistended, + BS MS: no deformity or atrophy  Skin: warm and dry, no rash Neuro:  Strength and sensation are intact Psych: euthymic mood, full affect   EKG:  EKG is ordered today. The ekg ordered today demonstrates sinus bradycardia with possible old inferior infarct.   Recent Labs: 12/20/2019: ALT 25; BUN 18; Creatinine, Ser 1.13; Potassium 4.4; Sodium 138; TSH 8.65    Lipid Panel    Component Value Date/Time   CHOL 135 12/20/2019 0902   CHOL 131 07/22/2019 0851   CHOL 155 07/23/2012 0909   TRIG 83.0 12/20/2019 0902   TRIG 97 07/23/2012 0909   HDL 42.70 12/20/2019 0902   HDL 45 07/22/2019 0851   HDL 43 07/23/2012 0909   CHOLHDL 3 12/20/2019 0902   VLDL 16.6 12/20/2019 0902   LDLCALC 76 12/20/2019 0902   LDLCALC 71 07/22/2019 0851   LDLCALC 93 07/23/2012 0909      Wt Readings from Last 3 Encounters:  08/17/20 239 lb 2 oz (108.5 kg)  02/10/20 230 lb (104.3 kg)  12/28/19 227 lb 3 oz (103.1 kg)        No flowsheet data found.    ASSESSMENT AND PLAN:  1.  Coronary artery disease involving native coronary arteries without angina: He is doing very well with no anginal symptoms.  I elected to discontinue prasugrel today.  Continue aspirin indefinitely.    2.  Essential hypertension: His blood pressure has been elevated the  last 2 visits.  I elected to add amlodipine 5 mg once daily.  I discussed with him the importance of getting more active and try to get his weight down.  He gained 16 pounds since last year.  3.  Hyperlipidemia: Continue high-dose rosuvastatin.  Most recent lipid profile showed an LDL of 76.  He will get repeat lipid profile in October and if LDL remains above 70, will likely add Zetia.  4.  Hypothyroidism: Currently on levothyroxine.    Disposition:   FU with me in 6 months  Signed,  November,  MD  08/17/2020 8:27 AM    [facility]

## 2020-10-05 ENCOUNTER — Other Ambulatory Visit: Payer: Self-pay | Admitting: Family Medicine

## 2020-12-25 ENCOUNTER — Other Ambulatory Visit: Payer: Self-pay | Admitting: Family Medicine

## 2020-12-25 DIAGNOSIS — E039 Hypothyroidism, unspecified: Secondary | ICD-10-CM

## 2020-12-25 DIAGNOSIS — E785 Hyperlipidemia, unspecified: Secondary | ICD-10-CM

## 2020-12-25 DIAGNOSIS — I10 Essential (primary) hypertension: Secondary | ICD-10-CM

## 2020-12-25 DIAGNOSIS — Z125 Encounter for screening for malignant neoplasm of prostate: Secondary | ICD-10-CM

## 2020-12-27 ENCOUNTER — Other Ambulatory Visit: Payer: BC Managed Care – PPO

## 2021-01-03 ENCOUNTER — Encounter: Payer: Self-pay | Admitting: Family Medicine

## 2021-01-03 ENCOUNTER — Ambulatory Visit (INDEPENDENT_AMBULATORY_CARE_PROVIDER_SITE_OTHER): Payer: 59 | Admitting: Family Medicine

## 2021-01-03 ENCOUNTER — Other Ambulatory Visit: Payer: Self-pay

## 2021-01-03 VITALS — BP 130/82 | HR 61 | Temp 97.5°F | Ht 74.5 in | Wt 241.2 lb

## 2021-01-03 DIAGNOSIS — Z1211 Encounter for screening for malignant neoplasm of colon: Secondary | ICD-10-CM

## 2021-01-03 DIAGNOSIS — R5383 Other fatigue: Secondary | ICD-10-CM

## 2021-01-03 DIAGNOSIS — Z0000 Encounter for general adult medical examination without abnormal findings: Secondary | ICD-10-CM | POA: Diagnosis not present

## 2021-01-03 DIAGNOSIS — E785 Hyperlipidemia, unspecified: Secondary | ICD-10-CM

## 2021-01-03 DIAGNOSIS — Z125 Encounter for screening for malignant neoplasm of prostate: Secondary | ICD-10-CM | POA: Diagnosis not present

## 2021-01-03 DIAGNOSIS — E039 Hypothyroidism, unspecified: Secondary | ICD-10-CM

## 2021-01-03 DIAGNOSIS — I2510 Atherosclerotic heart disease of native coronary artery without angina pectoris: Secondary | ICD-10-CM

## 2021-01-03 DIAGNOSIS — I10 Essential (primary) hypertension: Secondary | ICD-10-CM | POA: Diagnosis not present

## 2021-01-03 LAB — MICROALBUMIN / CREATININE URINE RATIO
Creatinine,U: 13.9 mg/dL
Microalb Creat Ratio: 5 mg/g (ref 0.0–30.0)
Microalb, Ur: <0.7 mg/dL (ref 0.0–1.9)

## 2021-01-03 LAB — COMPREHENSIVE METABOLIC PANEL
ALT: 23 U/L (ref 0–53)
AST: 16 U/L (ref 0–37)
Albumin: 4.4 g/dL (ref 3.5–5.2)
Alkaline Phosphatase: 53 U/L (ref 39–117)
BUN: 17 mg/dL (ref 6–23)
CO2: 26 mEq/L (ref 19–32)
Calcium: 9.5 mg/dL (ref 8.4–10.5)
Chloride: 103 mEq/L (ref 96–112)
Creatinine, Ser: 1.18 mg/dL (ref 0.40–1.50)
GFR: 65.91 mL/min (ref >60.00)
Glucose, Bld: 93 mg/dL (ref 70–99)
Potassium: 4.3 mEq/L (ref 3.5–5.1)
Sodium: 137 mEq/L (ref 135–145)
Total Bilirubin: 0.6 mg/dL (ref 0.2–1.2)
Total Protein: 6.8 g/dL (ref 6.0–8.3)

## 2021-01-03 LAB — CBC WITH DIFFERENTIAL/PLATELET
Basophils Absolute: 0.1 10*3/uL (ref 0.0–0.1)
Basophils Relative: 0.8 % (ref 0.0–3.0)
Eosinophils Absolute: 0.1 10*3/uL (ref 0.0–0.7)
Eosinophils Relative: 1.2 % (ref 0.0–5.0)
HCT: 45.8 % (ref 39.0–52.0)
Hemoglobin: 15.2 g/dL (ref 13.0–17.0)
Lymphocytes Relative: 26.3 % (ref 12.0–46.0)
Lymphs Abs: 1.9 10*3/uL (ref 0.7–4.0)
MCHC: 33.3 g/dL (ref 30.0–36.0)
MCV: 91 fl (ref 78.0–100.0)
Monocytes Absolute: 0.7 10*3/uL (ref 0.1–1.0)
Monocytes Relative: 9.9 % (ref 3.0–12.0)
Neutro Abs: 4.5 10*3/uL (ref 1.4–7.7)
Neutrophils Relative %: 61.8 % (ref 43.0–77.0)
Platelets: 287 10*3/uL (ref 150.0–400.0)
RBC: 5.04 Mil/uL (ref 4.22–5.81)
RDW: 13 % (ref 11.5–15.5)
WBC: 7.3 10*3/uL (ref 4.0–10.5)

## 2021-01-03 LAB — LIPID PANEL
Cholesterol: 150 mg/dL (ref 0–200)
HDL: 43.4 mg/dL (ref >39.00)
LDL Cholesterol: 83 mg/dL (ref 0–99)
NonHDL: 106.38
Total CHOL/HDL Ratio: 3
Triglycerides: 115 mg/dL (ref 0.0–149.0)
VLDL: 23 mg/dL (ref 0.0–40.0)

## 2021-01-03 LAB — VITAMIN D 25 HYDROXY (VIT D DEFICIENCY, FRACTURES): VITD: 92.16 ng/mL (ref 30.00–100.00)

## 2021-01-03 LAB — TSH: TSH: 5.57 u[IU]/mL — ABNORMAL HIGH (ref 0.35–5.50)

## 2021-01-03 LAB — PSA: PSA: 0.47 ng/mL (ref 0.10–4.00)

## 2021-01-03 LAB — VITAMIN B12: Vitamin B-12: 848 pg/mL (ref 211–911)

## 2021-01-03 MED ORDER — ROSUVASTATIN CALCIUM 40 MG PO TABS: 40.0000 mg | ORAL_TABLET | Freq: Every day | ORAL | 3 refills | Status: DC

## 2021-01-03 MED ORDER — LEVOTHYROXINE SODIUM 100 MCG PO TABS: 100.0000 ug | ORAL_TABLET | Freq: Every day | ORAL | 3 refills | Status: DC

## 2021-01-03 NOTE — Patient Instructions (Addendum)
Labs today  Pass by lab to pick up stool kit.  Consider shingrix vaccine.  Let me know doses of vitamin B12 and D to update your medicine list.  You are doing well today. Work on [employer] and lifestyle.  Return as needed or in 1 yr for next physical.   Health Maintenance, Male Adopting a healthy lifestyle and getting preventive care are important in promoting health and wellness. Ask your health care provider about: The right schedule for you to have regular tests and exams. Things you can do on your own to prevent diseases and keep yourself healthy. What should I know about diet, weight, and exercise? Eat a healthy diet  Eat a diet that includes plenty of vegetables, fruits, low-fat dairy products, and lean protein. Do not eat a lot of foods that are high in solid fats, added sugars, or sodium. Maintain a healthy weight Body mass index (BMI) is a measurement that can be used to identify possible weight problems. It estimates body fat based on height and weight. Your health care provider can help determine your BMI and help you achieve or maintain a healthy weight. Get regular exercise Get regular exercise. This is one of the most important things you can do for your health. Most adults should: Exercise for at least 150 minutes each week. The exercise should increase your heart rate and make you sweat (moderate-intensity exercise). Do strengthening exercises at least twice a week. This is in addition to the moderate-intensity exercise. Spend less time sitting. Even light physical activity can be beneficial. Watch cholesterol and blood lipids Have your blood tested for lipids and cholesterol at 63 years of age, then have this test every 5 years. You may need to have your cholesterol levels checked more often if: Your lipid or cholesterol levels are high. You are older than 63 years of age. You are at high risk for heart disease. What should I know about cancer screening? Many types  of cancers can be detected early and may often be prevented. Depending on your health history and family history, you may need to have cancer screening at various ages. This may include screening for: Colorectal cancer. Prostate cancer. Skin cancer. Lung cancer. What should I know about heart disease, diabetes, and high blood pressure? Blood pressure and heart disease High blood pressure causes heart disease and increases the risk of stroke. This is more likely to develop in people who have high blood pressure readings, are of African descent, or are overweight. Talk with your health care provider about your target blood pressure readings. Have your blood pressure checked: Every 3-5 years if you are 37-27 years of age. Every year if you are 63 years old or older. If you are between the ages of 58 and 97 and are a current or former smoker, ask your health care provider if you should have a one-time screening for abdominal aortic aneurysm (AAA). Diabetes Have regular diabetes screenings. This checks your fasting blood sugar level. Have the screening done: Once every three years after age 71 if you are at a normal weight and have a low risk for diabetes. More often and at a younger age if you are overweight or have a high risk for diabetes. What should I know about preventing infection? Hepatitis B If you have a higher risk for hepatitis B, you should be screened for this virus. Talk with your health care provider to find out if you are at risk for hepatitis B infection. Hepatitis  C Blood testing is recommended for: Everyone born from [birth year] through 1963. Anyone with known risk factors for hepatitis C. Sexually transmitted infections (STIs) You should be screened each year for STIs, including gonorrhea and chlamydia, if: You are sexually active and are younger than 63 years of age. You are older than 63 years of age and your health care provider tells you that you are at risk for this type of  infection. Your sexual activity has changed since you were last screened, and you are at increased risk for chlamydia or gonorrhea. Ask your health care provider if you are at risk. Ask your health care provider about whether you are at high risk for HIV. Your health care provider may recommend a prescription medicine to help prevent HIV infection. If you choose to take medicine to prevent HIV, you should first get tested for HIV. You should then be tested every 3 months for as long as you are taking the medicine. Follow these instructions at home: Lifestyle Do not use any products that contain nicotine or tobacco, such as cigarettes, e-cigarettes, and chewing tobacco. If you need help quitting, ask your health care provider. Do not use street drugs. Do not share needles. Ask your health care provider for help if you need support or information about quitting drugs. Alcohol use Do not drink alcohol if your health care provider tells you not to drink. If you drink alcohol: Limit how much you have to 0-2 drinks a day. Be aware of how much alcohol is in your drink. In the [place] one drink equals one 12 oz bottle of beer (355 mL), one 5 oz glass of wine (148 mL), or one 1 oz glass of hard liquor (44 mL). General instructions Schedule regular health, dental, and eye exams. Stay current with your vaccines. Tell your health care provider if: You often feel depressed. You have ever been abused or do not feel safe at home. Summary Adopting a healthy lifestyle and getting preventive care are important in promoting health and wellness. Follow your health care provider's instructions about healthy diet, exercising, and getting tested or screened for diseases. Follow your health care provider's instructions on monitoring your cholesterol and blood pressure. This information is not intended to replace advice given to you by your health care provider. Make sure you discuss any questions you have with your  health care provider. Document Revised: 04/28/2020 Document Reviewed: 02/11/2018 Elsevier Patient Education  2022 [employer].

## 2021-01-03 NOTE — Progress Notes (Signed)
Patient ID: Jackson Williamson, male    DOB: [birth year], 65 y.o.   MRN: [id number]  This visit was conducted in person.  BP 130/82   Pulse 61   Temp (!) 97.5 F (36.4 C) (Temporal)   Ht 6' 2.5" (1.892 m)   Wt 241 lb 3 oz (109.4 kg)   SpO2 98%   BMI 30.55 kg/m    CC: CPE Subjective:   HPI: Jackson Williamson is a 65 y.o. male presenting on 01/03/2021 for Annual Exam   COVID infection 03/2020 - for the 3rd time. 20 lb weight gain since then. Fatigue improved with potassium and iron supplements (started a few weeks ago).   Inferior STEMI 05/2019 s/p cath and PCI with DESx2 to RCA on crestor, aspirin. Completed DAPT x12 months (Effient). Sees Dr Fletcher Anon. New LDL goal <70.   Hypothyroidism - on 129mg daily brand synthroid (generic caused worsening GERD). he would like to try generic again due to cost.   Preventative: Colon cancer screening - aunt with colon cancer at age 63 iFOB normal yearly Prostate cancer screening - checked yearly due to fmhx (uncle). No nocturia or BPH symptoms.  Lung cancer screening - not eligible  Flu shot - declines Tetanus - 2006. Declines for now  COVID vaccine - discussed - declines  Shingrix - declines  Seat belt use discussed  Sunscreen use discussed, no changing moles Sleep - averaging 7 hours/night  Non smoker  Alcohol - 3 beers/mo  Dentist q6 mo  Eye exam - has not seen    Lives with daughter, 1 dog (gRestaurant manager, fast food  Occupation: cEditor, commissioning Edu: BS  Activity: regularly active at gym 4-5x/wk (Musician Diet: good water, fruits/vegetables daily, "80% paleo"      Relevant past medical, surgical, family and social history reviewed and updated as indicated. Interim medical history since our last visit reviewed. Allergies and medications reviewed and updated. Outpatient Medications Prior to Visit  Medication Sig Dispense Refill   aspirin 81 MG tablet Take 81 mg by mouth daily.      Coenzyme Q10 (CO Q-10) 100 MG CAPS Take 1 capsule by  mouth daily.  0   rosuvastatin (CRESTOR) 40 MG tablet Take 1 tablet (40 mg total) by mouth daily at 6 PM. 90 tablet 1   SYNTHROID 100 MCG tablet TAKE 1 TABLET BY MOUTH ONCE A DAY WITH BREAKFAST 90 tablet 0   amLODipine (NORVASC) 5 MG tablet Take 1 tablet (5 mg total) by mouth daily. 180 tablet 3   No facility-administered medications prior to visit.     Per HPI unless specifically indicated in ROS section below Review of Systems  Constitutional:  Negative for activity change, appetite change, chills, fatigue, fever and unexpected weight change.  HENT:  Negative for hearing loss.   Eyes:  Negative for visual disturbance.  Respiratory:  Negative for cough, chest tightness, shortness of breath and wheezing.   Cardiovascular:  Negative for chest pain, palpitations and leg swelling.  Gastrointestinal:  Negative for abdominal distention, abdominal pain, blood in stool, constipation, diarrhea, nausea and vomiting.  Genitourinary:  Negative for difficulty urinating and hematuria.  Musculoskeletal:  Negative for arthralgias, myalgias and neck pain.  Skin:  Negative for rash.  Neurological:  Negative for dizziness, seizures, syncope and headaches.  Hematological:  Negative for adenopathy. Does not bruise/bleed easily.  Psychiatric/Behavioral:  Negative for dysphoric mood. The patient is not nervous/anxious.    Objective:  BP 130/82   Pulse 61  Temp (!) 97.5 F (36.4 C) (Temporal)   Ht 6' 2.5" (1.892 m)   Wt 241 lb 3 oz (109.4 kg)   SpO2 98%   BMI 30.55 kg/m   Wt Readings from Last 3 Encounters:  01/03/21 241 lb 3 oz (109.4 kg)  08/17/20 239 lb 2 oz (108.5 kg)  02/10/20 230 lb (104.3 kg)      Physical Exam Vitals and nursing note reviewed.  Constitutional:      General: He is not in acute distress.    Appearance: Normal appearance. He is well-developed. He is not ill-appearing.  HENT:     Head: Normocephalic and atraumatic.     Right Ear: Hearing, tympanic membrane, ear canal and  external ear normal.     Left Ear: Hearing, tympanic membrane, ear canal and external ear normal.  Eyes:     General: No scleral icterus.    Extraocular Movements: Extraocular movements intact.     Conjunctiva/sclera: Conjunctivae normal.     Pupils: Pupils are equal, round, and reactive to light.  Neck:     Thyroid: No thyroid mass or thyromegaly.  Cardiovascular:     Rate and Rhythm: Normal rate and regular rhythm.     Pulses: Normal pulses.          Radial pulses are 2+ on the right side and 2+ on the left side.     Heart sounds: Normal heart sounds. No murmur heard. Pulmonary:     Effort: Pulmonary effort is normal. No respiratory distress.     Breath sounds: Normal breath sounds. No wheezing, rhonchi or rales.  Abdominal:     General: Bowel sounds are normal. There is no distension.     Palpations: Abdomen is soft. There is no mass.     Tenderness: There is no abdominal tenderness. There is no guarding or rebound.     Hernia: No hernia is present.  Musculoskeletal:        General: Normal range of motion.     Cervical back: Normal range of motion and neck supple.     Right lower leg: No edema.     Left lower leg: No edema.  Lymphadenopathy:     Cervical: No cervical adenopathy.  Skin:    General: Skin is warm and dry.     Findings: No rash.  Neurological:     General: No focal deficit present.     Mental Status: He is alert and oriented to person, place, and time.  Psychiatric:        Mood and Affect: Mood normal.        Behavior: Behavior normal.        Thought Content: Thought content normal.        Judgment: Judgment normal.      Results for orders placed or performed in visit on 03/16/20  Novel Coronavirus, NAA ([facility])   Specimen: Oropharyngeal(OP) collection in vial transport medium   Oropharyngea  Result Value Ref Range   SARS-CoV-2, NAA Not Detected Not Detected  SARS-COV-2, NAA 2 DAY TAT   Oropharyngea  Result Value Ref Range   SARS-CoV-2, NAA 2 DAY  TAT Performed   Specimen status report  Result Value Ref Range   specimen status report Comment     Assessment & Plan:  This visit occurred during the SARS-CoV-2 public health emergency.  Safety protocols were in place, including screening questions prior to the visit, additional usage of staff PPE, and extensive cleaning of exam room while observing  appropriate contact time as indicated for disinfecting solutions.   Problem List Items Addressed This Visit     Healthcare maintenance - Primary (Chronic)    Preventative protocols reviewed and updated unless pt declined. Discussed healthy diet and lifestyle.       Hyperlipidemia    Chronic, stable on crestor - continue. The ASCVD Risk score (Arnett DK, et al., 2019) failed to calculate for the following reasons:   The patient has a prior MI or stroke diagnosis       Relevant Medications   rosuvastatin (CRESTOR) 40 MG tablet   Hypothyroidism    Update TSH.  Would like to try generic again given cost of brand Synthroid. Will monitor for GERD symptoms.       Relevant Medications   levothyroxine (SYNTHROID) 100 MCG tablet   CAD (coronary artery disease)    S/p STEMI 05/2019. Completed DAPT now only on aspirin. Continues crestor, followed by cardiology.       Relevant Medications   rosuvastatin (CRESTOR) 40 MG tablet   Hypertension    Stable period on amlodipine 43m daily.       Relevant Medications   rosuvastatin (CRESTOR) 40 MG tablet   Fatigue    Check CBC, B12, vit D. He is currently on B12 and D supplementation      Relevant Orders   CBC with Differential/Platelet   Vitamin B12   VITAMIN D 25 Hydroxy (Vit-D Deficiency, Fractures)   Other Visit Diagnoses     Special screening for malignant neoplasms, colon       Relevant Orders   Fecal occult blood, imunochemical   Special screening for malignant neoplasm of prostate            Meds ordered this encounter  Medications   rosuvastatin (CRESTOR) 40 MG tablet     Sig: Take 1 tablet (40 mg total) by mouth daily at 6 PM.    Dispense:  90 tablet    Refill:  3   levothyroxine (SYNTHROID) 100 MCG tablet    Sig: Take 1 tablet (100 mcg total) by mouth daily.    Dispense:  90 tablet    Refill:  3    Orders Placed This Encounter  Procedures   Fecal occult blood, imunochemical    Standing Status:   Future    Standing Expiration Date:   01/03/2022   CBC with Differential/Platelet   Vitamin B12   VITAMIN D 25 Hydroxy (Vit-D Deficiency, Fractures)    Patient instructions: Labs today  Pass by lab to pick up stool kit.  Consider shingrix vaccine.  Let me know doses of vitamin B12 and D to update your medicine list.  You are doing well today. Work on hMirantand lifestyle.  Return as needed or in 1 yr for next physical.   Follow up plan: Return in about 1 year (around 01/03/2022), or if symptoms worsen or fail to improve, for annual exam, prior fasting for blood work.  JRia Bush MD

## 2021-01-03 NOTE — Assessment & Plan Note (Signed)
Preventative protocols reviewed and updated unless pt declined. Discussed healthy diet and lifestyle.  

## 2021-01-03 NOTE — Telephone Encounter (Signed)
Updated pt's med list.  

## 2021-01-03 NOTE — Assessment & Plan Note (Signed)
S/p STEMI 05/2019. Completed DAPT now only on aspirin. Continues crestor, followed by cardiology.

## 2021-01-03 NOTE — Assessment & Plan Note (Signed)
Chronic, stable on crestor - continue. The ASCVD Risk score (Arnett DK, et al., 2019) failed to calculate for the following reasons:   The patient has a prior MI or stroke diagnosis  

## 2021-01-03 NOTE — Assessment & Plan Note (Signed)
Check CBC, B12, vit D. He is currently on B12 and D supplementation

## 2021-01-03 NOTE — Assessment & Plan Note (Signed)
Stable period on amlodipine 5mg daily.  

## 2021-01-03 NOTE — Assessment & Plan Note (Addendum)
Update TSH.  Would like to try generic again given cost of brand Synthroid. Will monitor for GERD symptoms.

## 2021-01-04 ENCOUNTER — Other Ambulatory Visit: Payer: Self-pay | Admitting: Family Medicine

## 2021-01-04 MED ORDER — VITAMIN D3 LIQD: 4000.0000 [IU] | Freq: Every day | Status: AC

## 2021-01-17 ENCOUNTER — Other Ambulatory Visit (INDEPENDENT_AMBULATORY_CARE_PROVIDER_SITE_OTHER): Payer: 59

## 2021-01-17 ENCOUNTER — Encounter: Payer: Self-pay | Admitting: Family Medicine

## 2021-01-17 DIAGNOSIS — Z1211 Encounter for screening for malignant neoplasm of colon: Secondary | ICD-10-CM | POA: Diagnosis not present

## 2021-01-17 LAB — FECAL OCCULT BLOOD, GUAIAC: Fecal Occult Blood: NEGATIVE

## 2021-01-17 LAB — FECAL OCCULT BLOOD, IMMUNOCHEMICAL: Fecal Occult Bld: NEGATIVE

## 2021-04-07 ENCOUNTER — Other Ambulatory Visit: Payer: Self-pay | Admitting: Family Medicine

## 2021-04-11 ENCOUNTER — Encounter: Payer: Self-pay | Admitting: Family Medicine

## 2021-04-12 MED ORDER — LEVOTHYROXINE SODIUM 100 MCG PO TABS: 100.0000 ug | ORAL_TABLET | Freq: Every day | ORAL | 3 refills | Status: DC

## 2021-04-12 NOTE — Telephone Encounter (Signed)
E-scribed refill 

## 2021-08-03 ENCOUNTER — Other Ambulatory Visit
Admission: RE | Admit: 2021-08-03 | Discharge: 2021-08-03 | Disposition: A | Discharge status: Home / Self Care | Payer: 59 | Attending: Medical | Admitting: Medical

## 2021-08-03 ENCOUNTER — Encounter: Payer: Self-pay | Admitting: Medical

## 2021-08-03 ENCOUNTER — Ambulatory Visit: Payer: 59 | Admitting: Medical

## 2021-08-03 VITALS — BP 140/80 | HR 69 | Ht 75.0 in | Wt 235.0 lb

## 2021-08-03 DIAGNOSIS — E039 Hypothyroidism, unspecified: Secondary | ICD-10-CM | POA: Diagnosis not present

## 2021-08-03 DIAGNOSIS — R0609 Other forms of dyspnea: Secondary | ICD-10-CM | POA: Diagnosis not present

## 2021-08-03 DIAGNOSIS — E782 Mixed hyperlipidemia: Secondary | ICD-10-CM

## 2021-08-03 DIAGNOSIS — I10 Essential (primary) hypertension: Secondary | ICD-10-CM

## 2021-08-03 DIAGNOSIS — I2510 Atherosclerotic heart disease of native coronary artery without angina pectoris: Secondary | ICD-10-CM

## 2021-08-03 LAB — LIPID PANEL
Cholesterol: 174 mg/dL (ref 0–200)
HDL: 45 mg/dL (ref >40)
LDL Cholesterol: 117 mg/dL — ABNORMAL HIGH (ref 0–99)
Total CHOL/HDL Ratio: 3.9 RATIO
Triglycerides: 59 mg/dL (ref <150)
VLDL: 12 mg/dL (ref 0–40)

## 2021-08-03 LAB — TSH: TSH: 5.835 u[IU]/mL — ABNORMAL HIGH (ref 0.350–4.500)

## 2021-08-03 NOTE — Progress Notes (Signed)
Cardiology Office Note:    Date:  08/03/2021   ID:  Jackson Williamson, DOB March 26, 1955, MRN [id number]  PCP:  Eustaquio Boyden, MD  [facility] Cardiologist:  Lorine Bears, MD  [facility] Electrophysiologist:  None   Referring MD: Eustaquio Boyden, MD   Chief Complaint: 6 month follow-up  History of Present Illness:    Jackson Williamson is a 66 y.o. male with a hx of CAD s/p DESx2 RCA in 2021, HLD, HTN, hypothyroidism who presents for follow-up.   He presented in March 2021 with inferior STEMI. Cardiac cath showed occluded distal RCA with significant stenosis in the proximal vessel. There was chronic occlusio of OM1 and borderline significant disease in the mid left circumflex and mLAD and dLAD. The patient was treated with angioplasty and DES placement to the distal and proximal RCA. OM1 was probed with 2 wires but could not be crossed indicating chronic occlusion. Echo showed EF 50-55% with no WMA. He is bradycardic at baseline.   Last seen 08/17/20 and was fatigued from recent COVID infection. Prasugrel was discontinued. Amlodipine was added for elevated BP.   Today, the patient reports worsening DOE over the last month.  Notices breathing is short when he walks up the hill. No chest pain. Feels like before he had his heart attack, although not as severe. No LLE, orthopnea, pnd, palpitations. Occasional lightheadedness or dizziness when he bends over. EKG with no new changes.   Past Medical History:  Diagnosis Date   3-vessel CAD    LHC 05/03/2019: Sig 3v CAD, dRCA 100% /pRCA 70% RCA --> p/dRCA with DES, OM1 99% probed, mLCx 60%, mLAD 70%, dLAD 90%, mild elv LVEDP,    Diastolic dysfunction    3/1 echo EF 50-55%, G1DD   ED (erectile dysfunction)    GERD (gastroesophageal reflux disease)    related to generic levothyroxine   History of chicken pox    Hyperlipidemia    Hypothyroidism     Past Surgical History:  Procedure Laterality Date   CORONARY/GRAFT ACUTE MI REVASCULARIZATION  N/A 05/03/2019   Procedure: Coronary/Graft Acute MI Revascularization;  Surgeon: Iran Ouch, MD;  Location: [facility] INVASIVE CV LAB;  Service: Cardiovascular;  Laterality: N/A;   DOBUTAMINE STRESS ECHO  2013   normal per patient   HERNIA REPAIR Right 05/2012   RIH and umbilical   KNEE SURGERY  [phone]   left knee   LEFT HEART CATH AND CORONARY ANGIOGRAPHY N/A 05/03/2019   Procedure: LEFT HEART CATH AND CORONARY ANGIOGRAPHY;  Surgeon: Iran Ouch, MD;  Location: [facility] INVASIVE CV LAB;  Service: Cardiovascular;  Laterality: N/A;    Current Medications: Current Meds  Medication Sig   amLODipine (NORVASC) 5 MG tablet Take 1 tablet (5 mg total) by mouth daily.   aspirin 81 MG tablet Take 81 mg by mouth daily.    Cholecalciferol (VITAMIN D3) LIQD 4,000 Units by Does not apply route daily. Take 4 drops daily   Coenzyme Q10 (CO Q-10) 100 MG CAPS Take 1 capsule by mouth daily.   Cyanocobalamin (VITAMIN B12 PO) Take 2,500 mcg by mouth daily. Takes 1/2 dropper   levothyroxine (SYNTHROID) 100 MCG tablet Take 1 tablet (100 mcg total) by mouth daily.   rosuvastatin (CRESTOR) 40 MG tablet Take 1 tablet (40 mg total) by mouth daily at 6 PM.     Allergies:   Levothyroxine   Social History   Socioeconomic History   Marital status: Single    Spouse name: Not on file  Number of children: Not on file   Years of education: Not on file   Highest education level: Not on file  Occupational History   Not on file  Tobacco Use   Smoking status: Never   Smokeless tobacco: Former    Types: Snuff    Quit date: 03/18/2008   Tobacco comments:    social as youth  Substance and Sexual Activity   Alcohol use: No   Drug use: No   Sexual activity: Not Currently  Other Topics Concern   Not on file  Social History Narrative   Divorced, 1 dog (Advertising account planner)   Grown daughter   Occupation: Museum/gallery curator   Edu: BS   Activity: runs, bikes, swims, cross fit 5-6 d/wk   Diet: good water,  fruits/vegetables daily, "80% paleo"   Social Determinants of Health   Financial Resource Strain: Not on file  Food Insecurity: Not on file  Transportation Needs: Not on file  Physical Activity: Not on file  Stress: Not on file  Social Connections: Not on file     Family History: The patient's family history includes CAD (age of onset: 23) in his brother; CAD (age of onset: 46) in his mother; CAD (age of onset: 78) in his father; Cancer (age of onset: 63) in his cousin; Cancer (age of onset: 82) in his maternal uncle; Cancer (age of onset: 65) in his maternal aunt; Cancer (age of onset: 12) in his maternal uncle; Heart attack in his maternal grandfather and mother; High Cholesterol in his sister; Hyperlipidemia in his brother, father, and mother; Hypertension in his father; Stroke in his maternal grandfather.  ROS:   Please see the history of present illness.     All other systems reviewed and are negative.  EKGs/Labs/Other Studies Reviewed:    The following studies were reviewed today:  Echo 2021  1. Left ventricular ejection fraction, by estimation, is 50 to 55%. The  left ventricle has low normal function. The left ventricle has no regional  wall motion abnormalities. There is mild left ventricular hypertrophy.  Left ventricular diastolic  parameters are consistent with Grade I diastolic dysfunction (impaired  relaxation).   2. Right ventricular systolic function is normal. The right ventricular  size is normal. There is normal pulmonary artery systolic pressure.   3. The mitral valve is normal in structure and function. Trivial mitral  valve regurgitation. No evidence of mitral stenosis.   4. The aortic valve is normal in structure and function. Aortic valve  regurgitation is not visualized. No aortic stenosis is present.   Cardiac cath 2021 Mid Cx lesion is 60% stenosed. 1st Mrg lesion is 99% stenosed. Dist LAD lesion is 90% stenosed. Mid LAD lesion is 70%  stenosed. Prox LAD to Mid LAD lesion is 40% stenosed. 2nd Diag lesion is 50% stenosed. 1st Diag lesion is 60% stenosed. Dist RCA lesion is 100% stenosed. Post intervention, there is a 0% residual stenosis. A drug-eluting stent was successfully placed using a STENT RESOLUTE ONYX 2.5X15. Prox RCA lesion is 70% stenosed. Post intervention, there is a 0% residual stenosis. A drug-eluting stent was successfully placed using a STENT RESOLUTE ONYX 3.0X15. RPDA lesion is 100% stenosed.   1.  Significant underlying three-vessel coronary artery disease.  The culprit is an occluded distal right coronary artery with significant stenosis in the proximal vessel as well.  There is chronic subtotal occlusion of OM1 and borderline significant disease in the mid left circumflex, mid LAD and distal LAD. 2.  Mildly elevated left ventricular end-diastolic pressure.  Left ventricular angiography was not performed. 3.  Successful angioplasty and drug-eluting stent placement to the distal as well as proximal right coronary artery.  The OM1 subtotal occlusion was probed with 2 wires and was not able to cross indicating chronicity.   Recommendations: Dual antiplatelet therapy for at least 1 year. Obtain an echocardiogram. Aggressive treatment of risk factors. Left coronary artery disease can likely be treated with aggressive medical therapy.  FFR guided revascularization can be considered if the patient has residual anginal symptoms.    Coronary Diagrams  Diagnostic Dominance: Co-dominant Intervention    EKG:  EKG is  ordered today.  The ekg ordered today demonstrates NSR, 69bpm, TWI III and aVF, no changes  Recent Labs: 01/03/2021: ALT 23; BUN 17; Creatinine, Ser 1.18; Hemoglobin 15.2; Platelets 287.0; Potassium 4.3; Sodium 137; TSH 5.57  Recent Lipid Panel    Component Value Date/Time   CHOL 150 01/03/2021 0950   CHOL 131 07/22/2019 0851   CHOL 155 07/23/2012 0909   TRIG 115.0 01/03/2021 0950   TRIG  97 07/23/2012 0909   HDL 43.40 01/03/2021 0950   HDL 45 07/22/2019 0851   HDL 43 07/23/2012 0909   CHOLHDL 3 01/03/2021 0950   VLDL 23.0 01/03/2021 0950   LDLCALC 83 01/03/2021 0950   LDLCALC 71 07/22/2019 0851   LDLCALC 93 07/23/2012 0909     Physical Exam:    VS:  BP 140/80 (BP Location: Left Arm, Patient Position: Sitting, Cuff Size: Normal)   Pulse 69   Ht 6\' 3"  (1.905 m)   Wt 235 lb (106.6 kg)   SpO2 97%   BMI 29.37 kg/m     Wt Readings from Last 3 Encounters:  08/03/21 235 lb (106.6 kg)  01/03/21 241 lb 3 oz (109.4 kg)  08/17/20 239 lb 2 oz (108.5 kg)     GEN:  Well nourished, well developed in no acute distress HEENT: Normal NECK: No JVD; No carotid bruits LYMPHATICS: No lymphadenopathy CARDIAC: RRR, no murmurs, rubs, gallops RESPIRATORY:  Clear to auscultation without rales, wheezing or rhonchi  ABDOMEN: Soft, non-tender, non-distended MUSCULOSKELETAL:  No edema; No deformity  SKIN: Warm and dry NEUROLOGIC:  Alert and oriented x 3 PSYCHIATRIC:  Normal affect   ASSESSMENT:    1. Dyspnea on exertion   2. Coronary artery disease involving native coronary artery of native heart without angina pectoris   3. Essential hypertension   4. Hyperlipidemia, mixed   5. Hypothyroidism, unspecified type    PLAN:    In order of problems listed above:  DOE CAD with DES x2 RCA in 2021 Patient reports worsening DOE for the last month, no chest pain. He is euvolemic on exam and overall very active. EKG with no new ischemic changes. LHC from 2021 was reviewed, may need revascularization of the LAD for refractory symptoms. We will start, however, with a treadmill stress test and an echocardiogram. Continue Aspirin and statin. No BB with bradycardia.  HTN BP is mildly elevated. Continue current medications.   HLD Re-check fasting lipid panel today. Continue statin therapy. Add Zetia if LDL is above goal.   Hypothyroidism He is requesting repeat TSH, which we will  order. He is on Levothyroxine.   Disposition: Follow up  1-2 month  with MD/APP     Signed, Donnika Kucher David StallH Jaqualin Serpa, PA-C  08/03/2021 1:57 PM    [facility]

## 2021-08-03 NOTE — Patient Instructions (Signed)
Medication Instructions:   Your physician recommends that you continue on your current medications as directed. Please refer to the Current Medication list given to you today.   *If you need a refill on your cardiac medications before your next appointment, please call your pharmacy*   Lab Work:  Today: lipid panel, TSH  [facility] Mall Entrance at [facility] 1st desk on the right to check in (REGISTRATION)  Lab hours: Monday- Friday (7:30 am- 5:30 pm)   If you have labs (blood work) drawn today and your tests are completely normal, you will receive your results only by: MyChart Message (if you have MyChart) OR A paper copy in the mail If you have any lab test that is abnormal or we need to change your treatment, we will call you to review the results.   Testing/Procedures:  Your physician has requested that you have an echocardiogram. Echocardiography is a painless test that uses sound waves to create images of your heart. It provides your doctor with information about the size and shape of your heart and how well your heart's chambers and valves are working. This procedure takes approximately one hour. There are no restrictions for this procedure.    Your physician has requested that you have an exercise tolerance test (treadmill stress test).    - you may eat a light breakfast/ lunch prior to your procedure - no caffeine for 24 hours prior to your test (coffee, tea, soft drinks, or chocolate)  - no smoking/ vaping for 4 hours prior to your test - you may take your regular medications the day of your test - bring any inhalers with you to your test - wear comfortable clothing & tennis/ non-skid shoes to walk on the treadmill       Follow-Up: At [facility], you and your health needs are our priority.  As part of our continuing mission to provide you with exceptional heart care, we have created designated Provider Care Teams.  These Care Teams include your primary Cardiologist  (physician) and Advanced Practice Providers (APPs -  Physician Assistants and Nurse Practitioners) who all work together to provide you with the care you need, when you need it.    We recommend signing up for the patient portal called "MyChart".  Sign up information is provided on this After Visit Summary.  MyChart is used to connect with patients for Virtual Visits (Telemedicine).  Patients are able to view lab/test results, encounter notes, upcoming appointments, etc.  Non-urgent messages can be sent to your provider as well.   To learn more about what you can do with MyChart, go to [email or web address].    Your next appointment:   1-2 month(s)  The format for your next appointment:   In Person  Provider:   You may see Lorine Bears, MD or one of the following Advanced Practice Providers on your designated Care Team:   Nicolasa Ducking, NP Eula Listen, PA-C Cadence Fransico Michael, [place]   Other Instructions N/A  Important Information About Sugar

## 2021-08-20 ENCOUNTER — Ambulatory Visit: Payer: 59

## 2021-08-20 DIAGNOSIS — R0609 Other forms of dyspnea: Secondary | ICD-10-CM | POA: Diagnosis not present

## 2021-08-22 ENCOUNTER — Encounter: Payer: Self-pay | Admitting: Family Medicine

## 2021-08-22 LAB — EXERCISE TOLERANCE TEST
Angina Index: 0
Base ST Depression (mm): 0 mm
Duke Treadmill Score: 2
Estimated workload: 8.4
Exercise duration (min): 7 min
Exercise duration (sec): 1 s
MPHR: 157 {beats}/min
Peak HR: 142 {beats}/min
Percent HR: 90 %
RPE: 13
Rest HR: 74 {beats}/min
ST Depression (mm): 1 mm

## 2021-08-23 ENCOUNTER — Ambulatory Visit: Payer: 59 | Admitting: Medical

## 2021-08-23 ENCOUNTER — Telehealth: Payer: Self-pay | Admitting: Medical

## 2021-08-23 ENCOUNTER — Encounter: Payer: Self-pay | Admitting: Medical

## 2021-08-23 ENCOUNTER — Other Ambulatory Visit
Admission: RE | Admit: 2021-08-23 | Discharge: 2021-08-23 | Disposition: A | Discharge status: Home / Self Care | Payer: 59 | Source: Ambulatory Visit | Attending: Medical | Admitting: Medical

## 2021-08-23 VITALS — BP 148/90 | HR 54 | Ht 75.0 in | Wt 233.1 lb

## 2021-08-23 DIAGNOSIS — R9439 Abnormal result of other cardiovascular function study: Secondary | ICD-10-CM | POA: Diagnosis not present

## 2021-08-23 DIAGNOSIS — R0609 Other forms of dyspnea: Secondary | ICD-10-CM

## 2021-08-23 DIAGNOSIS — I10 Essential (primary) hypertension: Secondary | ICD-10-CM

## 2021-08-23 DIAGNOSIS — E782 Mixed hyperlipidemia: Secondary | ICD-10-CM | POA: Diagnosis not present

## 2021-08-23 DIAGNOSIS — I2510 Atherosclerotic heart disease of native coronary artery without angina pectoris: Secondary | ICD-10-CM | POA: Insufficient documentation

## 2021-08-23 LAB — BASIC METABOLIC PANEL
Anion gap: 8 (ref 5–15)
BUN: 21 mg/dL (ref 8–23)
CO2: 23 mmol/L (ref 22–32)
Calcium: 9.7 mg/dL (ref 8.9–10.3)
Chloride: 106 mmol/L (ref 98–111)
Creatinine, Ser: 1.38 mg/dL — ABNORMAL HIGH (ref 0.61–1.24)
GFR, Estimated: 57 mL/min — ABNORMAL LOW (ref >60)
Glucose, Bld: 100 mg/dL — ABNORMAL HIGH (ref 70–99)
Potassium: 4 mmol/L (ref 3.5–5.1)
Sodium: 137 mmol/L (ref 135–145)

## 2021-08-23 LAB — CBC WITH DIFFERENTIAL/PLATELET
Abs Immature Granulocytes: 0.02 10*3/uL (ref 0.00–0.07)
Basophils Absolute: 0 10*3/uL (ref 0.0–0.1)
Basophils Relative: 1 %
Eosinophils Absolute: 0.1 10*3/uL (ref 0.0–0.5)
Eosinophils Relative: 1 %
HCT: 47.4 % (ref 39.0–52.0)
Hemoglobin: 15.8 g/dL (ref 13.0–17.0)
Immature Granulocytes: 0 %
Lymphocytes Relative: 26 %
Lymphs Abs: 1.9 10*3/uL (ref 0.7–4.0)
MCH: 29.7 pg (ref 26.0–34.0)
MCHC: 33.3 g/dL (ref 30.0–36.0)
MCV: 89.1 fL (ref 80.0–100.0)
Monocytes Absolute: 0.5 10*3/uL (ref 0.1–1.0)
Monocytes Relative: 6 %
Neutro Abs: 4.9 10*3/uL (ref 1.7–7.7)
Neutrophils Relative %: 66 %
Platelets: 295 10*3/uL (ref 150–400)
RBC: 5.32 MIL/uL (ref 4.22–5.81)
RDW: 13.3 % (ref 11.5–15.5)
WBC: 7.3 10*3/uL (ref 4.0–10.5)
nRBC: 0 % (ref 0.0–0.2)

## 2021-08-23 MED ORDER — NITROGLYCERIN 0.4 MG SL SUBL: 0.4000 mg | SUBLINGUAL_TABLET | SUBLINGUAL | 3 refills | Status: AC | PRN

## 2021-08-23 MED ORDER — SODIUM CHLORIDE 0.9% FLUSH: 3.0000 mL | Freq: Two times a day (BID) | INTRAVENOUS | Status: DC

## 2021-08-23 NOTE — Telephone Encounter (Signed)
Patient seen today for o/v with CF. Closing this encounter.

## 2021-08-23 NOTE — Patient Instructions (Signed)
Medication Instructions:  Your physician has recommended you make the following change in your medication:   An Rx for sublingual Nitro-glycerin has been sent to your pharmacy. Use as directed.  *If you need a refill on your cardiac medications before your next appointment, please call your pharmacy*   Lab Work: Bmp and Cbc today.  Please have your labs drawn at the [facility] Stop at the Registration desk to check in.  If you have labs (blood work) drawn today and your tests are completely normal, you will receive your results only by: MyChart Message (if you have MyChart) OR A paper copy in the mail If you have any lab test that is abnormal or we need to change your treatment, we will call you to review the results.   Testing/Procedures: Your physician has requested that you have a cardiac catheterization. Cardiac catheterization is used to diagnose and/or treat various heart conditions. Doctors may recommend this procedure for a number of different reasons. The most common reason is to evaluate chest pain. Chest pain can be a symptom of coronary artery disease (CAD), and cardiac catheterization can show whether plaque is narrowing or blocking your heart's arteries. This procedure is also used to evaluate the valves, as well as measure the blood flow and oxygen levels in different parts of your heart. For further information please visit [email or web address] Please follow instruction sheet, as given.    Follow-Up: At [facility], you and your health needs are our priority.  As part of our continuing mission to provide you with exceptional heart care, we have created designated Provider Care Teams.  These Care Teams include your primary Cardiologist (physician) and Advanced Practice Providers (APPs -  Physician Assistants and Nurse Practitioners) who all work together to provide you with the care you need, when you need it.  We recommend signing up for the patient portal called  "MyChart".  Sign up information is provided on this After Visit Summary.  MyChart is used to connect with patients for Virtual Visits (Telemedicine).  Patients are able to view lab/test results, encounter notes, upcoming appointments, etc.  Non-urgent messages can be sent to your provider as well.   To learn more about what you can do with MyChart, go to [email or web address].    Your next appointment:   2-3 week(s)  The format for your next appointment:   In Person  Provider:   You may see Lorine Bears, MD or one of the following Advanced Practice Providers on your designated Care Team:   Nicolasa Ducking, NP Eula Listen, PA-C Cadence Fransico Michael, [place]   Other Instructions  [facility] CARDIOVASCULAR DIVISION [facility] [address] [place] [address] Dept: [phone] Loc: [phone]  RYLER LASKOWSKI  08/23/2021  You are scheduled for a Cardiac Catheterization on Friday, June 23 with Dr. Lorine Bears.  1. Please arrive at the [facility] [address] [place], [place] [address] at 6:30 AM (This time is one hour before your procedure to ensure your preparation). Free valet parking service is available.   Special note: Every effort is made to have your procedure done on time. Please understand that emergencies sometimes delay scheduled procedures.  2. Diet: Do not eat solid foods after midnight.  You may have clear liquids until 5 AM upon the day of the procedure.  3. Labs: You will need to have blood drawn on Thursday, June 22 at [facility], Go  to 1st desk on your right to register.  Address: [address] [place], [place] [address]  Open: 8am - 5pm  Phone: [phone]. You do not need to be fasting.  4. Medication instructions in preparation for your procedure:   Contrast Allergy: No  On the morning of your procedure, take Aspirin and any morning medicines NOT listed above.   You may use sips of water.  5. Plan to go home the same day, you will only stay overnight if medically necessary. 6. You MUST have a responsible adult to drive you home. 7. An adult MUST be with you the first 24 hours after you arrive home. 8. Bring a current list of your medications, and the last time and date medication taken. 9. Bring ID and current insurance cards. 10.Please wear clothes that are easy to get on and off and wear slip-on shoes.  Thank you for allowing [place] to care for you!   -- [facility]    Nitroglycerin Sublingual Tablets What is this medication? NITROGLYCERIN (nye troe GLI ser in) prevents and treats chest pain (angina). It works by relaxing blood vessels, which decreases the amount of work the heart has to do. It belongs to a group of medications called nitrates. This medicine may be used for other purposes; ask your health care provider or pharmacist if you have questions. COMMON BRAND NAME(S): Nitroquick, Nitrostat, Nitrotab What should I tell my care team before I take this medication? They need to know if you have any of these conditions: Anemia Head injury, recent stroke, or bleeding in the brain Liver disease Previous heart attack An unusual or allergic reaction to nitroglycerin, other medications, foods, dyes, or preservatives Pregnant or trying to get pregnant Breast-feeding How should I use this medication? Take this medication by mouth as needed. Use at the first sign of an angina attack (chest pain or tightness). You can also take this medication 5 to 10 minutes before an event likely to produce chest pain. Follow the directions exactly as written on the prescription label. Place one tablet under your tongue and let it dissolve. Do not swallow whole. Replace the dose if you accidentally swallow it. It will help if your mouth is not dry. Saliva around the tablet will help it to dissolve more quickly. Do not eat or drink,  smoke or chew tobacco while a tablet is dissolving. Sit down when taking this medication. In an angina attack, you should feel better within 5 minutes after your first dose. You can take a dose every 5 minutes up to a total of 3 doses. If you do not feel better or feel worse after 1 dose, call 9-1-1 at once. Do not take more than 3 doses in 15 minutes. Your care team might give you other directions. Follow those directions if they do. Do not take your medication more often than directed. Talk to your care team about the use of this medication in children. Special care may be needed. Overdosage: If you think you have taken too much of this medicine contact a poison control center or emergency room at once. NOTE: This medicine is only for you. Do not share this medicine with others. What if I miss a dose? This does not apply. This medication is only used as needed. What may interact with this medication? Do not take this medication with any of the following: Certain migraine medications like ergotamine and dihydroergotamine (DHE) Medications used to treat erectile dysfunction like sildenafil, tadalafil,  and vardenafil Riociguat This medication may also interact with the following: Alteplase Aspirin Heparin Medications for high blood pressure Medications for mental depression Other medications used to treat angina Phenothiazines like chlorpromazine, mesoridazine, prochlorperazine, thioridazine This list may not describe all possible interactions. Give your health care provider a list of all the medicines, herbs, non-prescription drugs, or dietary supplements you use. Also tell them if you smoke, drink alcohol, or use illegal drugs. Some items may interact with your medicine. What should I watch for while using this medication? Tell your care team if you feel your medication is no longer working. Keep this medication with you at all times. Sit or lie down when you take your medication to prevent  falling if you feel dizzy or faint after using it. Try to remain calm. This will help you to feel better faster. If you feel dizzy, take several deep breaths and lie down with your feet propped up, or bend forward with your head resting between your knees. You may get drowsy or dizzy. Do not drive, use machinery, or do anything that needs mental alertness until you know how this medication affects you. Do not stand or sit up quickly, especially if you are an older patient. This reduces the risk of dizzy or fainting spells. Alcohol can make you more drowsy and dizzy. Avoid alcoholic drinks. Do not treat yourself for coughs, colds, or pain while you are taking this medication without asking your care team for advice. Some ingredients may increase your blood pressure. What side effects may I notice from receiving this medication? Side effects that you should report to your care team as soon as possible: Allergic reactions--skin rash, itching, hives, swelling of the face, lips, tongue, or throat Headache, unusual weakness or fatigue, shortness of breath, nausea, vomiting, rapid heartbeat, blue skin or lips, which may be signs of methemoglobinemia Increased pressure around the brain--severe headache, blurry vision, change in vision, nausea, vomiting Low blood pressure--dizziness, feeling faint or lightheaded, blurry vision Slow heartbeat--dizziness, feeling faint or lightheaded, confusion, trouble breathing, unusual weakness or fatigue Worsening chest pain (angina)--pain, pressure, or tightness in the chest, neck, back, or arms Side effects that usually do not require medical attention (report to your care team if they continue or are bothersome): Dizziness Flushing Headache This list may not describe all possible side effects. Call your doctor for medical advice about side effects. You may report side effects to FDA at 1-800-FDA-1088. Where should I keep my medication? Keep out of the reach of  children. Store at room temperature between 20 and 25 degrees C (68 and 77 degrees F). Store in Retail buyer. Protect from light and moisture. Keep tightly closed. Throw away any unused medication after the expiration date. NOTE: This sheet is a summary. It may not cover all possible information. If you have questions about this medicine, talk to your doctor, pharmacist, or health care provider.  2023 Elsevier/Gold Standard (2020-05-30 00:00:00)

## 2021-08-23 NOTE — Progress Notes (Signed)
Cardiology Office Note:    Date:  08/23/2021   ID:  Jackson Williamson, DOB Jul 10, 1955, MRN [id number]  PCP:  Default, Provider, MD  [facility] Cardiologist:  Lorine Bears, MD  [facility] Electrophysiologist:  None   Referring MD: No ref. provider found   Chief Complaint: abnormal stress test  History of Present Illness:    Jackson Williamson is a 66 y.o. male with a hx of CAD s/p DESx2 RCA in 2021, HLD, HTN, hypothyroidism who presents for follow-up.    He presented in March 2021 with inferior STEMI. Cardiac cath showed occluded distal RCA with significant stenosis in the proximal vessel. There was chronic occlusio of OM1 and borderline significant disease in the mid left circumflex and mLAD and dLAD. The patient was treated with angioplasty and DES placement to the distal and proximal RCA. OM1 was probed with 2 wires but could not be crossed indicating chronic occlusion. Echo showed EF 50-55% with no WMA. He is bradycardic at baseline.   Last seen 08/03/21 and reported worsening exertional dyspnea. Treadmill test and echocardiogram were ordered. Treadmill test showed ST depression in the inferior leads and he was brought in to discuss cardiac cath.   Today, the patient reports no change in symptoms, overall has been feeling well. He denies chest pain, just some DOE. Treadmill test was discussed with the patient and he is agreeable to undergo cardiac cath. No LLE, orthopnea, or pnd.     Past Medical History:  Diagnosis Date   3-vessel CAD    LHC 05/03/2019: Sig 3v CAD, dRCA 100% /pRCA 70% RCA --> p/dRCA with DES, OM1 99% probed, mLCx 60%, mLAD 70%, dLAD 90%, mild elv LVEDP,    Diastolic dysfunction    3/1 echo EF 50-55%, G1DD   ED (erectile dysfunction)    GERD (gastroesophageal reflux disease)    related to generic levothyroxine   History of chicken pox    Hyperlipidemia    Hypothyroidism     Past Surgical History:  Procedure Laterality Date   CORONARY/GRAFT ACUTE MI  REVASCULARIZATION N/A 05/03/2019   Procedure: Coronary/Graft Acute MI Revascularization;  Surgeon: Iran Ouch, MD;  Location: [facility] INVASIVE CV LAB;  Service: Cardiovascular;  Laterality: N/A;   DOBUTAMINE STRESS ECHO  2013   normal per patient   HERNIA REPAIR Right 05/2012   RIH and umbilical   KNEE SURGERY  [phone]   left knee   LEFT HEART CATH AND CORONARY ANGIOGRAPHY N/A 05/03/2019   Procedure: LEFT HEART CATH AND CORONARY ANGIOGRAPHY;  Surgeon: Iran Ouch, MD;  Location: [facility] INVASIVE CV LAB;  Service: Cardiovascular;  Laterality: N/A;    Current Medications: Current Meds  Medication Sig   amLODipine (NORVASC) 5 MG tablet Take 1 tablet (5 mg total) by mouth daily.   aspirin 81 MG tablet Take 81 mg by mouth daily.    Cholecalciferol (VITAMIN D3) LIQD 4,000 Units by Does not apply route daily. Take 4 drops daily   Coenzyme Q10 (CO Q-10) 100 MG CAPS Take 1 capsule by mouth daily.   Cyanocobalamin (VITAMIN B12 PO) Take 2,500 mcg by mouth daily. Takes 1/2 dropper   levothyroxine (SYNTHROID) 100 MCG tablet Take 1 tablet (100 mcg total) by mouth daily.   nitroGLYCERIN (NITROSTAT) 0.4 MG SL tablet Place 1 tablet (0.4 mg total) under the tongue every 5 (five) minutes as needed for chest pain.   rosuvastatin (CRESTOR) 40 MG tablet Take 1 tablet (40 mg total) by mouth daily at 6 PM.  Current Facility-Administered Medications for the 08/23/21 encounter (Office Visit) with Fransico Michael, Jamine Wingate H, PA-C  Medication   sodium chloride flush (NS) 0.9 % injection 3 mL     Allergies:   Levothyroxine   Social History   Socioeconomic History   Marital status: Single    Spouse name: Not on file   Number of children: Not on file   Years of education: Not on file   Highest education level: Not on file  Occupational History   Not on file  Tobacco Use   Smoking status: Never   Smokeless tobacco: Former    Types: Snuff    Quit date: 03/18/2008   Tobacco comments:    social as youth   Substance and Sexual Activity   Alcohol use: No   Drug use: No   Sexual activity: Not Currently  Other Topics Concern   Not on file  Social History Narrative   Divorced, 1 dog (Advertising account planner)   Grown daughter   Occupation: Museum/gallery curator   Edu: BS   Activity: runs, bikes, swims, cross fit 5-6 d/wk   Diet: good water, fruits/vegetables daily, "80% paleo"   Social Determinants of Health   Financial Resource Strain: Not on file  Food Insecurity: Not on file  Transportation Needs: Not on file  Physical Activity: Not on file  Stress: Not on file  Social Connections: Not on file     Family History: The patient's family history includes CAD (age of onset: 34) in his brother; CAD (age of onset: 65) in his mother; CAD (age of onset: 27) in his father; Cancer (age of onset: 14) in his cousin; Cancer (age of onset: 84) in his maternal uncle; Cancer (age of onset: 39) in his maternal aunt; Cancer (age of onset: 76) in his maternal uncle; Heart attack in his maternal grandfather and mother; High Cholesterol in his sister; Hyperlipidemia in his brother, father, and mother; Hypertension in his father; Stroke in his maternal grandfather.  ROS:   Please see the history of present illness.     All other systems reviewed and are negative.  EKGs/Labs/Other Studies Reviewed:    The following studies were reviewed today:  Treadmill test 08/1921   Exercise capacity was normal. Patient exercised for 7 min and 1 sec. Maximum HR of 142 bpm. MPHR 90.0 %. Peak METS 8.4 . The patient experienced no angina during the test. Hypertensive blood pressure response noted during stress.   1.0 mm of down sloping ST depression in the inferior leads (II, III, aVF, V3, V4, V5 and V6) was noted. ST changes persisted 5 minutes into recovery. The ECG was positive for ischemia.   Abnormal treadmill stress test with evidence of ischemia with significant ST depression in the inferior leads as well as V3 through V6 that  persisted 5 minutes into recovery.  Elevated blood pressure at baseline with hypertensive response to exercise.  Echo 2021  1. Left ventricular ejection fraction, by estimation, is 50 to 55%. The  left ventricle has low normal function. The left ventricle has no regional  wall motion abnormalities. There is mild left ventricular hypertrophy.  Left ventricular diastolic  parameters are consistent with Grade I diastolic dysfunction (impaired  relaxation).   2. Right ventricular systolic function is normal. The right ventricular  size is normal. There is normal pulmonary artery systolic pressure.   3. The mitral valve is normal in structure and function. Trivial mitral  valve regurgitation. No evidence of mitral stenosis.   4. The  aortic valve is normal in structure and function. Aortic valve  regurgitation is not visualized. No aortic stenosis is present.    Cardiac cath 2021 Mid Cx lesion is 60% stenosed. 1st Mrg lesion is 99% stenosed. Dist LAD lesion is 90% stenosed. Mid LAD lesion is 70% stenosed. Prox LAD to Mid LAD lesion is 40% stenosed. 2nd Diag lesion is 50% stenosed. 1st Diag lesion is 60% stenosed. Dist RCA lesion is 100% stenosed. Post intervention, there is a 0% residual stenosis. A drug-eluting stent was successfully placed using a STENT RESOLUTE ONYX 2.5X15. Prox RCA lesion is 70% stenosed. Post intervention, there is a 0% residual stenosis. A drug-eluting stent was successfully placed using a STENT RESOLUTE ONYX 3.0X15. RPDA lesion is 100% stenosed.   1.  Significant underlying three-vessel coronary artery disease.  The culprit is an occluded distal right coronary artery with significant stenosis in the proximal vessel as well.  There is chronic subtotal occlusion of OM1 and borderline significant disease in the mid left circumflex, mid LAD and distal LAD. 2.  Mildly elevated left ventricular end-diastolic pressure.  Left ventricular angiography was not performed. 3.   Successful angioplasty and drug-eluting stent placement to the distal as well as proximal right coronary artery.  The OM1 subtotal occlusion was probed with 2 wires and was not able to cross indicating chronicity.   Recommendations: Dual antiplatelet therapy for at least 1 year. Obtain an echocardiogram. Aggressive treatment of risk factors. Left coronary artery disease can likely be treated with aggressive medical therapy.  FFR guided revascularization can be considered if the patient has residual anginal symptoms.     Coronary Diagrams   Diagnostic Dominance: Co-dominant Intervention       EKG:  EKG is  ordered today.  The ekg ordered today demonstrates NSR 54bpm, TWI inferior leads  Recent Labs: 01/03/2021: ALT 23; BUN 17; Creatinine, Ser 1.18; Hemoglobin 15.2; Platelets 287.0; Potassium 4.3; Sodium 137 08/03/2021: TSH 5.835  Recent Lipid Panel    Component Value Date/Time   CHOL 174 08/03/2021 1426   CHOL 131 07/22/2019 0851   CHOL 155 07/23/2012 0909   TRIG 59 08/03/2021 1426   TRIG 97 07/23/2012 0909   HDL 45 08/03/2021 1426   HDL 45 07/22/2019 0851   HDL 43 07/23/2012 0909   CHOLHDL 3.9 08/03/2021 1426   VLDL 12 08/03/2021 1426   LDLCALC 117 (H) 08/03/2021 1426   LDLCALC 71 07/22/2019 0851   LDLCALC 93 07/23/2012 0909     Physical Exam:    VS:  BP (!) 148/90 (BP Location: Left Arm, Patient Position: Sitting, Cuff Size: Large)   Pulse (!) 54   Ht 6\' 3"  (1.905 m)   Wt 233 lb 2 oz (105.7 kg)   SpO2 98%   BMI 29.14 kg/m     Wt Readings from Last 3 Encounters:  08/23/21 233 lb 2 oz (105.7 kg)  08/03/21 235 lb (106.6 kg)  01/03/21 241 lb 3 oz (109.4 kg)     GEN:  Well nourished, well developed in no acute distress HEENT: Normal NECK: No JVD; No carotid bruits LYMPHATICS: No lymphadenopathy CARDIAC: RRR, no murmurs, rubs, gallops RESPIRATORY:  Clear to auscultation without rales, wheezing or rhonchi  ABDOMEN: Soft, non-tender,  non-distended MUSCULOSKELETAL:  No edema; No deformity  SKIN: Warm and dry NEUROLOGIC:  Alert and oriented x 3 PSYCHIATRIC:  Normal affect   ASSESSMENT:    1. Coronary artery disease involving native coronary artery of native heart without angina pectoris  2. Abnormal stress test   3. Dyspnea on exertion   4. Essential hypertension   5. Hyperlipidemia, mixed    PLAN:    In order of problems listed above:  DOE, suspected angina equivalent Abnormal treadmill test CAD with DES x2 RCA in 2021 Recent treadmill test was abnormal showing ST depression in the inferior leads. LHC from 2021 was reviewed, may need revascularization of the LAD for refractory symptoms. Patient denies overt chest pain, he reports DOE on heavy exertion. Plan to pursue cardiac cath given abnormal treadmill test and angina. Continue Aspirin and statin therapy. Can consider BB at follow-up. Further recommendations pending cardiac cath.   HTN BP is mildly elevated, can address at follow-up. Continue current medications.   HLD LDL 117, goal <70. Continue Crestor 40mg  daily. Will discuss Zetia at follow-up.   Disposition: Follow up in 2-3 week(s) with MD/APP   Shared Decision Making/Informed Consent   Shared Decision Making/Informed Consent The risks [stroke (1 in 1000), death (1 in 1000), kidney failure [usually temporary] (1 in 500), bleeding (1 in 200), allergic reaction [possibly serious] (1 in 200)], benefits (diagnostic support and management of coronary artery disease) and alternatives of a cardiac catheterization were discussed in detail with Mr. Plantz and he is willing to proceed.    Signed, Markan Cazarez Cliffton Asters, PA-C  08/23/2021 3:30 PM    [facility]

## 2021-08-23 NOTE — Telephone Encounter (Signed)
Patient made aware of stress test results and Jackson Williamson, [place] recommendation. Pt sts that he plans on traveling to [place] to see family and his flight is this evening at 6 pm. Advised the patient that I will ask Jackson to call him to discuss his travel plans and any restrictions.  Jackson Williamson, [place] called the pt and lmtcb.

## 2021-08-23 NOTE — Telephone Encounter (Signed)
Furth, Cadence H, PA-C  Stress test was abnormal with evidence of ischemia (blockages). Patient likely needs cardiac cath. We can bring him in to discuss or I can call for consent

## 2021-08-24 ENCOUNTER — Encounter
Admission: RE | Disposition: A | Discharge status: Home / Self Care | Payer: Self-pay | Source: Home / Self Care | Attending: Cardiovascular Disease

## 2021-08-24 ENCOUNTER — Other Ambulatory Visit: Payer: Self-pay

## 2021-08-24 ENCOUNTER — Encounter: Payer: Self-pay | Admitting: Cardiovascular Disease

## 2021-08-24 ENCOUNTER — Ambulatory Visit
Admission: RE | Admit: 2021-08-24 | Discharge: 2021-08-24 | Disposition: A | Discharge status: Home / Self Care | Payer: 59 | Attending: Cardiovascular Disease | Admitting: Cardiovascular Disease

## 2021-08-24 ENCOUNTER — Ambulatory Visit: Payer: 59 | Admitting: Medical

## 2021-08-24 DIAGNOSIS — Z955 Presence of coronary angioplasty implant and graft: Secondary | ICD-10-CM | POA: Insufficient documentation

## 2021-08-24 DIAGNOSIS — Z7982 Long term (current) use of aspirin: Secondary | ICD-10-CM | POA: Insufficient documentation

## 2021-08-24 DIAGNOSIS — R0609 Other forms of dyspnea: Secondary | ICD-10-CM | POA: Insufficient documentation

## 2021-08-24 DIAGNOSIS — I2510 Atherosclerotic heart disease of native coronary artery without angina pectoris: Secondary | ICD-10-CM | POA: Diagnosis not present

## 2021-08-24 DIAGNOSIS — I2582 Chronic total occlusion of coronary artery: Secondary | ICD-10-CM | POA: Insufficient documentation

## 2021-08-24 DIAGNOSIS — E782 Mixed hyperlipidemia: Secondary | ICD-10-CM | POA: Insufficient documentation

## 2021-08-24 DIAGNOSIS — I208 Other forms of angina pectoris: Secondary | ICD-10-CM

## 2021-08-24 DIAGNOSIS — E039 Hypothyroidism, unspecified: Secondary | ICD-10-CM | POA: Diagnosis not present

## 2021-08-24 DIAGNOSIS — I252 Old myocardial infarction: Secondary | ICD-10-CM | POA: Diagnosis not present

## 2021-08-24 DIAGNOSIS — R9439 Abnormal result of other cardiovascular function study: Secondary | ICD-10-CM | POA: Diagnosis not present

## 2021-08-24 DIAGNOSIS — I10 Essential (primary) hypertension: Secondary | ICD-10-CM | POA: Diagnosis not present

## 2021-08-24 DIAGNOSIS — E785 Hyperlipidemia, unspecified: Secondary | ICD-10-CM | POA: Diagnosis present

## 2021-08-24 DIAGNOSIS — Z79899 Other long term (current) drug therapy: Secondary | ICD-10-CM | POA: Insufficient documentation

## 2021-08-24 DIAGNOSIS — I25118 Atherosclerotic heart disease of native coronary artery with other forms of angina pectoris: Secondary | ICD-10-CM | POA: Insufficient documentation

## 2021-08-24 DIAGNOSIS — I2089 Other forms of angina pectoris: Secondary | ICD-10-CM

## 2021-08-24 DIAGNOSIS — Z87891 Personal history of nicotine dependence: Secondary | ICD-10-CM | POA: Insufficient documentation

## 2021-08-24 HISTORY — PX: CORONARY STENT INTERVENTION: CATH118234

## 2021-08-24 HISTORY — PX: INTRAVASCULAR ULTRASOUND/IVUS: CATH118244

## 2021-08-24 HISTORY — PX: LEFT HEART CATH AND CORONARY ANGIOGRAPHY: CATH118249

## 2021-08-24 LAB — POCT ACTIVATED CLOTTING TIME: Activated Clotting Time: 299 seconds

## 2021-08-24 SURGERY — LEFT HEART CATH AND CORONARY ANGIOGRAPHY
Anesthesia: Moderate Sedation

## 2021-08-24 MED ORDER — CO Q-10 100 MG PO CAPS: 1.0000 | ORAL_CAPSULE | Freq: Every day | ORAL | Status: DC

## 2021-08-24 MED ORDER — FENTANYL CITRATE (PF) 100 MCG/2ML IJ SOLN
INTRAMUSCULAR | Status: AC
  Filled 2021-08-24: qty 2

## 2021-08-24 MED ORDER — NITROGLYCERIN 1 MG/10 ML FOR IR/CATH LAB
INTRA_ARTERIAL | Status: AC
  Filled 2021-08-24: qty 10

## 2021-08-24 MED ORDER — VITAMIN D 25 MCG (1000 UNIT) PO TABS: 4000.0000 [IU] | ORAL_TABLET | Freq: Every day | ORAL | Status: DC

## 2021-08-24 MED ORDER — ACETAMINOPHEN 325 MG PO TABS: 650.0000 mg | ORAL_TABLET | ORAL | Status: DC | PRN

## 2021-08-24 MED ORDER — CLOPIDOGREL BISULFATE 75 MG PO TABS: 75.0000 mg | ORAL_TABLET | Freq: Every day | ORAL | 3 refills | Status: DC

## 2021-08-24 MED ORDER — CLOPIDOGREL BISULFATE 75 MG PO TABS
ORAL_TABLET | ORAL | Status: AC
  Filled 2021-08-24: qty 8

## 2021-08-24 MED ORDER — HEPARIN (PORCINE) IN NACL 2000-0.9 UNIT/L-% IV SOLN
INTRAVENOUS | Status: DC | PRN
  Administered 2021-08-24: 1000 mL

## 2021-08-24 MED ORDER — ASPIRIN 81 MG PO CHEW
CHEWABLE_TABLET | ORAL | Status: AC
  Filled 2021-08-24: qty 2

## 2021-08-24 MED ORDER — LEVOTHYROXINE SODIUM 112 MCG PO TABS
112.0000 ug | ORAL_TABLET | Freq: Every day | ORAL | 2 refills | Status: DC
Start: 2021-08-24 — End: 2022-01-05

## 2021-08-24 MED ORDER — HEPARIN SODIUM (PORCINE) 1000 UNIT/ML IJ SOLN
INTRAMUSCULAR | Status: AC
  Filled 2021-08-24: qty 10

## 2021-08-24 MED ORDER — AMLODIPINE BESYLATE 5 MG PO TABS: 5.0000 mg | ORAL_TABLET | Freq: Every day | ORAL | Status: DC

## 2021-08-24 MED ORDER — SODIUM CHLORIDE 0.9% FLUSH: 3.0000 mL | INTRAVENOUS | Status: DC | PRN

## 2021-08-24 MED ORDER — SODIUM CHLORIDE 0.9 % IV SOLN: 250.0000 mL | INTRAVENOUS | Status: DC | PRN

## 2021-08-24 MED ORDER — ONDANSETRON HCL 4 MG/2ML IJ SOLN: 4.0000 mg | Freq: Four times a day (QID) | INTRAMUSCULAR | Status: DC | PRN

## 2021-08-24 MED ORDER — SODIUM CHLORIDE 0.9% FLUSH: 3.0000 mL | Freq: Two times a day (BID) | INTRAVENOUS | Status: DC

## 2021-08-24 MED ORDER — HEPARIN SODIUM (PORCINE) 1000 UNIT/ML IJ SOLN
INTRAMUSCULAR | Status: DC | PRN
  Administered 2021-08-24 (×2): 5000 [IU] via INTRAVENOUS

## 2021-08-24 MED ORDER — FENTANYL CITRATE (PF) 100 MCG/2ML IJ SOLN
INTRAMUSCULAR | Status: DC | PRN
  Administered 2021-08-24: 50 ug via INTRAVENOUS

## 2021-08-24 MED ORDER — VERAPAMIL HCL 2.5 MG/ML IV SOLN
INTRAVENOUS | Status: DC | PRN
  Administered 2021-08-24: 2.5 mg via INTRA_ARTERIAL

## 2021-08-24 MED ORDER — NITROGLYCERIN 0.4 MG SL SUBL
0.4000 mg | SUBLINGUAL_TABLET | SUBLINGUAL | Status: DC | PRN
Start: 2021-08-24 — End: 2021-08-24

## 2021-08-24 MED ORDER — CLOPIDOGREL BISULFATE 75 MG PO TABS
ORAL_TABLET | ORAL | Status: DC | PRN
  Administered 2021-08-24: 600 mg via ORAL

## 2021-08-24 MED ORDER — HEPARIN (PORCINE) IN NACL 1000-0.9 UT/500ML-% IV SOLN
INTRAVENOUS | Status: AC
  Filled 2021-08-24: qty 1000

## 2021-08-24 MED ORDER — LEVOTHYROXINE SODIUM 112 MCG PO TABS: 112.0000 ug | ORAL_TABLET | Freq: Every day | ORAL | Status: DC

## 2021-08-24 MED ORDER — LIDOCAINE HCL 1 % IJ SOLN
INTRAMUSCULAR | Status: AC
  Filled 2021-08-24: qty 20

## 2021-08-24 MED ORDER — ASPIRIN 81 MG PO CHEW
CHEWABLE_TABLET | ORAL | Status: DC | PRN
  Administered 2021-08-24: 162 mg via ORAL

## 2021-08-24 MED ORDER — VERAPAMIL HCL 2.5 MG/ML IV SOLN
INTRAVENOUS | Status: AC
  Filled 2021-08-24: qty 2

## 2021-08-24 MED ORDER — SODIUM CHLORIDE 0.9 % IV SOLN: INTRAVENOUS | Status: DC

## 2021-08-24 MED ORDER — MIDAZOLAM HCL 2 MG/2ML IJ SOLN
INTRAMUSCULAR | Status: AC
  Filled 2021-08-24: qty 2

## 2021-08-24 MED ORDER — SODIUM CHLORIDE 0.9 % WEIGHT BASED INFUSION
3.0000 mL/kg/h | INTRAVENOUS | Status: DC
  Administered 2021-08-24: 3 mL/kg/h via INTRAVENOUS

## 2021-08-24 MED ORDER — ASPIRIN 81 MG PO CHEW
81.0000 mg | CHEWABLE_TABLET | ORAL | Status: AC
  Administered 2021-08-24: 81 mg via ORAL

## 2021-08-24 MED ORDER — CLOPIDOGREL BISULFATE 75 MG PO TABS: 75.0000 mg | ORAL_TABLET | Freq: Every day | ORAL | Status: DC

## 2021-08-24 MED ORDER — ASPIRIN 81 MG PO TBEC
81.0000 mg | DELAYED_RELEASE_TABLET | Freq: Every day | ORAL | Status: DC
Start: 2021-08-24 — End: 2021-08-24

## 2021-08-24 MED ORDER — SODIUM CHLORIDE 0.9 % WEIGHT BASED INFUSION: 1.0000 mL/kg/h | INTRAVENOUS | Status: DC

## 2021-08-24 MED ORDER — SODIUM CHLORIDE 0.9 % IV SOLN
250.0000 mL | INTRAVENOUS | Status: DC | PRN
Start: 2021-08-24 — End: 2021-08-24

## 2021-08-24 MED ORDER — NITROGLYCERIN 1 MG/10 ML FOR IR/CATH LAB
INTRA_ARTERIAL | Status: DC | PRN
  Administered 2021-08-24: 200 ug via INTRACORONARY

## 2021-08-24 MED ORDER — MIDAZOLAM HCL 2 MG/2ML IJ SOLN
INTRAMUSCULAR | Status: DC | PRN
  Administered 2021-08-24: 1 mg via INTRAVENOUS

## 2021-08-24 MED ORDER — ROSUVASTATIN CALCIUM 10 MG PO TABS
40.0000 mg | ORAL_TABLET | Freq: Every day | ORAL | Status: DC
Start: 2021-08-24 — End: 2021-08-24

## 2021-08-24 MED ORDER — IOHEXOL 300 MG/ML  SOLN
INTRAMUSCULAR | Status: DC | PRN
  Administered 2021-08-24: 120 mL

## 2021-08-24 SURGICAL SUPPLY — 18 items
BAND CMPR LRG ZPHR (HEMOSTASIS) ×2
BAND ZEPHYR COMPRESS 30 LONG (HEMOSTASIS) ×1 IMPLANT
CATH 5FR JL3.5 JR4 ANG PIG MP (CATHETERS) ×1 IMPLANT
CATH EAGLE EYE PLAT IMAGING (CATHETERS) ×1 IMPLANT
CATH LAUNCHER 6FR EBU 4 (CATHETERS) ×1 IMPLANT
DRAPE BRACHIAL (DRAPES) ×1 IMPLANT
GLIDESHEATH SLEND SS 6F .021 (SHEATH) ×1 IMPLANT
GUIDEWIRE INQWIRE 1.5J.035X260 (WIRE) IMPLANT
INQWIRE 1.5J .035X260CM (WIRE) ×3
KIT ENCORE 26 ADVANTAGE (KITS) ×1 IMPLANT
KIT SYRINGE INJ CVI SPIKEX1 (MISCELLANEOUS) ×1 IMPLANT
PACK CARDIAC CATH (CUSTOM PROCEDURE TRAY) ×3 IMPLANT
PROTECTION STATION PRESSURIZED (MISCELLANEOUS) ×3
SET ATX SIMPLICITY (MISCELLANEOUS) ×1 IMPLANT
STATION PROTECTION PRESSURIZED (MISCELLANEOUS) IMPLANT
STENT ONYX FRONTIER 4.0X18 (Permanent Stent) ×1 IMPLANT
TUBING CIL FLEX 10 FLL-RA (TUBING) ×1 IMPLANT
WIRE RUNTHROUGH .014X180CM (WIRE) ×1 IMPLANT

## 2021-08-24 NOTE — Discharge Summary (Addendum)
Discharge Summary for Same Day PCI   Patient ID: Jackson Williamson MRN: [id number]; DOB: 1955/08/30  Admit date: 08/24/2021 Discharge date: 08/24/2021  Primary Care Provider: Eustaquio Boyden, MD  Primary Cardiologist: Lorine Bears, MD  Primary Electrophysiologist:  None   Discharge Diagnoses    Principal Problem:   Abnormal stress test Active Problems:   Hyperlipidemia   CAD (coronary artery disease)   Hypertension   Anginal equivalent [facility])   Diagnostic Studies/Procedures    Cardiac Catheterization 08/24/2021:  Cardiac cath was done and showed patent RCA stents with no significant restenosis.  There was progression of mid left circumflex stenosis to 80% which was significant by IVUS.  This was treated by stent placement.  There was 50% proximal LAD stenosis and stable 70% distal LAD stenosis which was left to be treated medically.   Recommendations: Dual antiplatelet therapy for at least 6 months. Aggressive treatment of risk factors. Possible discharge home later today. Full report to follow. _____________   History of Present Illness     Jackson Williamson is a 66 y.o. male with h/o CAD s/p DESx2 RCA in 2021, HLD, HTN, and hypothyroidism who presented for cardiac catheterization.   He was seen 08/03/21 and reported worsening dyspnea on exertion and a treadmill test was ordered. This showed down sloping ST depression of the inferior leads and he was brought back in the office. He was seen again 08/23/21 and was set up for outpatient left heart catheterization.  Cardiac catheterization was arranged for further evaluation.  Hospital Course     The patient underwent cardiac cath as noted above with patent RCA stents with no significant stenosis, progression of mid left circumflex stenosis to 80% which was significant by IVUS whih was treated with stent placement. There was 50% pLAD stenosis and 70% distal LAD stenosis which was left to be treated medically. Plan for DAPT with  ASA/Plavix for at least 6 month.  There were no observed complications post cath. Radial right cath site was re-evaluated prior to discharge and found to be stable without any complications. Instructions/precautions regarding cath site care were given prior to discharge.  [name] was seen by Dr. Kirke Corin and determined stable for discharge home. Follow up with our office has been arranged. Medications are listed below. Pertinent changes include addition of Plavix 75mg  daily.  _____________  Cath/PCI Registry Performance & Quality Measures: Aspirin prescribed? - Yes ADP Receptor Inhibitor (Plavix/Clopidogrel, Brilinta/Ticagrelor or Effient/Prasugrel) prescribed (includes medically managed patients)? - Yes High Intensity Statin (Lipitor 40-80mg  or Crestor 20-40mg ) prescribed? - Yes For EF <40%, was ACEI/Williamson prescribed? - Not Applicable (EF >/= 40%) For EF <40%, Aldosterone Antagonist (Spironolactone or Eplerenone) prescribed? - Not Applicable (EF >/= 40%) Cardiac Rehab Phase II ordered (Included Medically managed Patients)? - Yes  _____________   Discharge Vitals Blood pressure (!) 141/91, pulse (!) 57, temperature 98 F (36.7 C), temperature source Oral, resp. rate 16, height 6\' 3"  (1.905 m), weight 103 kg, SpO2 96 %.  Filed Weights   08/24/21 0723  Weight: 103 kg    Last Labs & Radiologic Studies    CBC Recent Labs    08/23/21 1552  WBC 7.3  NEUTROABS 4.9  HGB 15.8  HCT 47.4  MCV 89.1  PLT 295   Basic Metabolic Panel Recent Labs    40/98/11 1552  NA 137  K 4.0  CL 106  CO2 23  GLUCOSE 100*  BUN 21  CREATININE 1.38*  CALCIUM 9.7   Liver Function  Tests No results for input(s): "AST", "ALT", "ALKPHOS", "BILITOT", "PROT", "ALBUMIN" in the last 72 hours. No results for input(s): "LIPASE", "AMYLASE" in the last 72 hours. High Sensitivity Troponin:   No results for input(s): "TROPONINIHS" in the last 720 hours.  BNP Invalid input(s): "POCBNP" D-Dimer No results  for input(s): "DDIMER" in the last 72 hours. Hemoglobin A1C No results for input(s): "HGBA1C" in the last 72 hours. Fasting Lipid Panel No results for input(s): "CHOL", "HDL", "LDLCALC", "TRIG", "CHOLHDL", "LDLDIRECT" in the last 72 hours. Thyroid Function Tests No results for input(s): "TSH", "T4TOTAL", "T3FREE", "THYROIDAB" in the last 72 hours.  Invalid input(s): "FREET3" _____________  Exercise Tolerance Test  Result Date: 08/22/2021   Exercise capacity was normal. Patient exercised for 7 min and 1 sec. Maximum HR of 142 bpm. MPHR 90.0 %. Peak METS 8.4 . The patient experienced no angina during the test. Hypertensive blood pressure response noted during stress.   1.0 mm of down sloping ST depression in the inferior leads (II, III, aVF, V3, V4, V5 and V6) was noted. ST changes persisted 5 minutes into recovery. The ECG was positive for ischemia. Abnormal treadmill stress test with evidence of ischemia with significant ST depression in the inferior leads as well as V3 through V6 that persisted 5 minutes into recovery.  Elevated blood pressure at baseline with hypertensive response to exercise.    Disposition   Pt is being discharged home today in good condition.  Follow-up Plans & Appointments     Discharge Instructions     AMB Referral to Cardiac Rehabilitation - Phase II   Complete by: As directed    Diagnosis:  Coronary Stents Stable Angina     After initial evaluation and assessments completed: Virtual Based Care may be provided alone or in conjunction with Phase 2 Cardiac Rehab based on patient barriers.: Yes   Diet - low sodium heart healthy   Complete by: As directed    Discharge instructions   Complete by: As directed    No driving for 1 week. No lifting over 10 lbs for 2 weeks. No sexual activity for 2 weeks. You may return to work on when cleared by cardiologist. Keep procedure site clean & dry. If you notice increased pain, swelling, bleeding or pus, call/return!  You  may shower, but no soaking baths/hot tubs/pools for 1 week.   Increase activity slowly   Complete by: As directed         Discharge Medications   Allergies as of 08/24/2021       Reactions   Levothyroxine Other (See Comments)   Severe GERD, can take name brand only        Medication List     TAKE these medications    amLODipine 5 MG tablet Commonly known as: NORVASC Take 1 tablet (5 mg total) by mouth daily.   aspirin 81 MG tablet Take 81 mg by mouth daily.   clopidogrel 75 MG tablet Commonly known as: Plavix Take 1 tablet (75 mg total) by mouth daily.   Co Q-10 100 MG Caps Take 1 capsule by mouth daily.   levothyroxine 112 MCG tablet Commonly known as: SYNTHROID Take 1 tablet (112 mcg total) by mouth daily.   nitroGLYCERIN 0.4 MG SL tablet Commonly known as: NITROSTAT Place 1 tablet (0.4 mg total) under the tongue every 5 (five) minutes as needed for chest pain.   rosuvastatin 40 MG tablet Commonly known as: CRESTOR Take 1 tablet (40 mg total) by mouth daily at  6 PM.   VITAMIN B12 PO Take 2,500 mcg by mouth daily. Takes 1/2 dropper   Vitamin D3 Liqd 4,000 Units by Does not apply route daily. Take 4 drops daily           Allergies Allergies  Allergen Reactions   Levothyroxine Other (See Comments)    Severe GERD, can take name brand only    Outstanding Labs/Studies   None  Duration of Discharge Encounter   Greater than 30 minutes including physician time.  Signed, Taleah Bellantoni David Stall, PA-C 08/24/2021, 11:19 AM

## 2021-08-24 NOTE — Progress Notes (Signed)
Cardiac cath was done and showed patent RCA stents with no significant restenosis.  There was progression of mid left circumflex stenosis to 80% which was significant by IVUS.  This was treated by stent placement.  There was 50% proximal LAD stenosis and stable 70% distal LAD stenosis which was left to be treated medically.  Recommendations: Dual antiplatelet therapy for at least 6 months. Aggressive treatment of risk factors. Possible discharge home later today. Full report to follow.

## 2021-08-26 ENCOUNTER — Other Ambulatory Visit: Payer: Self-pay

## 2021-08-26 ENCOUNTER — Emergency Department: Payer: 59

## 2021-08-26 DIAGNOSIS — Z5321 Procedure and treatment not carried out due to patient leaving prior to being seen by health care provider: Secondary | ICD-10-CM | POA: Insufficient documentation

## 2021-08-26 DIAGNOSIS — R079 Chest pain, unspecified: Secondary | ICD-10-CM | POA: Diagnosis not present

## 2021-08-26 DIAGNOSIS — R61 Generalized hyperhidrosis: Secondary | ICD-10-CM | POA: Insufficient documentation

## 2021-08-26 DIAGNOSIS — Z955 Presence of coronary angioplasty implant and graft: Secondary | ICD-10-CM | POA: Diagnosis not present

## 2021-08-26 DIAGNOSIS — R0789 Other chest pain: Secondary | ICD-10-CM | POA: Insufficient documentation

## 2021-08-26 DIAGNOSIS — Z7902 Long term (current) use of antithrombotics/antiplatelets: Secondary | ICD-10-CM | POA: Insufficient documentation

## 2021-08-26 DIAGNOSIS — R419 Unspecified symptoms and signs involving cognitive functions and awareness: Secondary | ICD-10-CM | POA: Diagnosis not present

## 2021-08-26 LAB — TROPONIN I (HIGH SENSITIVITY): Troponin I (High Sensitivity): 129 ng/L (ref <18)

## 2021-08-26 LAB — CBC
HCT: 45.6 % (ref 39.0–52.0)
Hemoglobin: 15.1 g/dL (ref 13.0–17.0)
MCH: 29.4 pg (ref 26.0–34.0)
MCHC: 33.1 g/dL (ref 30.0–36.0)
MCV: 88.9 fL (ref 80.0–100.0)
Platelets: 301 10*3/uL (ref 150–400)
RBC: 5.13 MIL/uL (ref 4.22–5.81)
RDW: 13.2 % (ref 11.5–15.5)
WBC: 8.4 10*3/uL (ref 4.0–10.5)
nRBC: 0 % (ref 0.0–0.2)

## 2021-08-26 LAB — BASIC METABOLIC PANEL
Anion gap: 7 (ref 5–15)
BUN: 28 mg/dL — ABNORMAL HIGH (ref 8–23)
CO2: 20 mmol/L — ABNORMAL LOW (ref 22–32)
Calcium: 9.2 mg/dL (ref 8.9–10.3)
Chloride: 110 mmol/L (ref 98–111)
Creatinine, Ser: 1.43 mg/dL — ABNORMAL HIGH (ref 0.61–1.24)
GFR, Estimated: 55 mL/min — ABNORMAL LOW (ref >60)
Glucose, Bld: 104 mg/dL — ABNORMAL HIGH (ref 70–99)
Potassium: 3.9 mmol/L (ref 3.5–5.1)
Sodium: 137 mmol/L (ref 135–145)

## 2021-08-26 NOTE — ED Notes (Signed)
Pt not answering when name called in lobby. Attempts to call phone number listed in chart unsuccessful, just going straight to voicemail.

## 2021-08-27 ENCOUNTER — Telehealth: Payer: Self-pay | Admitting: Emergency Medicine

## 2021-08-27 ENCOUNTER — Emergency Department
Admission: EM | Admit: 2021-08-27 | Discharge: 2021-08-27 | Discharge status: Against Medical Advice | Payer: 59 | Attending: Emergency Medicine | Admitting: Emergency Medicine

## 2021-08-27 ENCOUNTER — Encounter: Payer: Self-pay | Admitting: Cardiovascular Disease

## 2021-08-30 DIAGNOSIS — I10 Essential (primary) hypertension: Secondary | ICD-10-CM | POA: Diagnosis not present

## 2021-08-30 DIAGNOSIS — R072 Precordial pain: Secondary | ICD-10-CM | POA: Diagnosis not present

## 2021-08-30 DIAGNOSIS — R0789 Other chest pain: Secondary | ICD-10-CM | POA: Diagnosis not present

## 2021-08-30 DIAGNOSIS — R079 Chest pain, unspecified: Secondary | ICD-10-CM | POA: Diagnosis not present

## 2021-09-07 ENCOUNTER — Ambulatory Visit: Payer: 59 | Admitting: Medical

## 2021-09-10 NOTE — Progress Notes (Deleted)
Cardiology Office Note:    Date:  09/10/2021   ID:  Jackson Williamson, DOB 1955-08-21, MRN [id number]  PCP:  Eustaquio Boyden, MD  [facility] Cardiologist:  Lorine Bears, MD  [facility] Electrophysiologist:  None   Referring MD: Eustaquio Boyden, MD   Chief Complaint: 2-3 week post cath  History of Present Illness:    Jackson Williamson is a 66 y.o. male with a hx of  h/o CAD s/p DESx2 RCA in 2021, HLD, HTN, and hypothyroidism who presented for cardiac catheterization.    He was seen 08/03/21 and reported worsening dyspnea on exertion and a treadmill test was ordered. This showed down sloping ST depression of the inferior leads and he was brought back in the office. He was seen again 08/23/21 and was set up for outpatient left heart catheterization.   Cardiac catheterization was arranged for further evaluation. Cardiac cath was done and showed patent RCA stents with no significant restenosis.  There was progression of mid left circumflex stenosis to 80% which was significant by IVUS.  This was treated by stent placement.  There was 50% proximal LAD stenosis and stable 70% distal LAD stenosis which was left to be treated medically. Recommendations for Dual antiplatelet therapy for at least 6 months.  Pertinent changes include addition of Plavix 75mg  daily.  Today,   Past Medical History:  Diagnosis Date   3-vessel CAD    LHC 05/03/2019: Sig 3v CAD, dRCA 100% /pRCA 70% RCA --> p/dRCA with DES, OM1 99% probed, mLCx 60%, mLAD 70%, dLAD 90%, mild elv LVEDP,    Diastolic dysfunction    3/1 echo EF 50-55%, G1DD   ED (erectile dysfunction)    GERD (gastroesophageal reflux disease)    related to generic levothyroxine   History of chicken pox    Hyperlipidemia    Hypothyroidism     Past Surgical History:  Procedure Laterality Date   CORONARY STENT INTERVENTION N/A 08/24/2021   Procedure: CORONARY STENT INTERVENTION;  Surgeon: 08/26/2021, MD;  Location: [facility] INVASIVE CV LAB;   Service: Cardiovascular;  Laterality: N/A;   CORONARY/GRAFT ACUTE MI REVASCULARIZATION N/A 05/03/2019   Procedure: Coronary/Graft Acute MI Revascularization;  Surgeon: 07/03/2019, MD;  Location: [facility] INVASIVE CV LAB;  Service: Cardiovascular;  Laterality: N/A;   DOBUTAMINE STRESS ECHO  2013   normal per patient   HERNIA REPAIR Right 05/2012   RIH and umbilical   INTRAVASCULAR ULTRASOUND/IVUS N/A 08/24/2021   Procedure: Intravascular Ultrasound/IVUS;  Surgeon: 08/26/2021, MD;  Location: [facility] INVASIVE CV LAB;  Service: Cardiovascular;  Laterality: N/A;   KNEE SURGERY  [phone]   left knee   LEFT HEART CATH AND CORONARY ANGIOGRAPHY N/A 05/03/2019   Procedure: LEFT HEART CATH AND CORONARY ANGIOGRAPHY;  Surgeon: 07/03/2019, MD;  Location: [facility] INVASIVE CV LAB;  Service: Cardiovascular;  Laterality: N/A;   LEFT HEART CATH AND CORONARY ANGIOGRAPHY Left 08/24/2021   Procedure: LEFT HEART CATH AND CORONARY ANGIOGRAPHY;  Surgeon: 08/26/2021, MD;  Location: [facility] INVASIVE CV LAB;  Service: Cardiovascular;  Laterality: Left;    Current Medications: No outpatient medications have been marked as taking for the 09/11/21 encounter (Appointment) with 11/12/21, Donell Tomkins H, PA-C.     Allergies:   Levothyroxine   Social History   Socioeconomic History   Marital status: Single    Spouse name: Not on file   Number of children: Not on file   Years of education: Not on file   Highest education  level: Not on file  Occupational History   Not on file  Tobacco Use   Smoking status: Never   Smokeless tobacco: Former    Types: Snuff    Quit date: 03/18/2008   Tobacco comments:    social as youth  Substance and Sexual Activity   Alcohol use: No   Drug use: No   Sexual activity: Not Currently  Other Topics Concern   Not on file  Social History Narrative   Divorced, 1 dog (Advertising account planner)   Grown daughter   Occupation: Museum/gallery curator   Edu: BS   Activity: runs, bikes, swims,  cross fit 5-6 d/wk   Diet: good water, fruits/vegetables daily, "80% paleo"   Social Determinants of Health   Financial Resource Strain: Not on file  Food Insecurity: Not on file  Transportation Needs: Not on file  Physical Activity: Not on file  Stress: Not on file  Social Connections: Not on file     Family History: The patient's family history includes CAD (age of onset: 36) in his brother; CAD (age of onset: 46) in his mother; CAD (age of onset: 68) in his father; Cancer (age of onset: 67) in his cousin; Cancer (age of onset: 47) in his maternal uncle; Cancer (age of onset: 75) in his maternal aunt; Cancer (age of onset: 52) in his maternal uncle; Heart attack in his maternal grandfather and mother; High Cholesterol in his sister; Hyperlipidemia in his brother, father, and mother; Hypertension in his father; Stroke in his maternal grandfather.  ROS:   Please see the history of present illness.     All other systems reviewed and are negative.  EKGs/Labs/Other Studies Reviewed:    The following studies were reviewed today:  LHC 08/2021   Dist LAD-1 lesion is 70% stenosed.   Prox LAD to Mid LAD lesion is 40% stenosed.   2nd Diag lesion is 50% stenosed.   1st Diag lesion is 60% stenosed.   1st Mrg lesion is 100% stenosed.   Mid Cx lesion is 80% stenosed.   Dist LAD-2 lesion is 80% stenosed.   Non-stenotic Dist RCA lesion was previously treated.   Non-stenotic Prox RCA lesion was previously treated.   A drug-eluting stent was successfully placed using a STENT ONYX FRONTIER [id number].   Post intervention, there is a 0% residual stenosis.   The left ventricular systolic function is normal.   LV end diastolic pressure is mildly elevated.   The left ventricular ejection fraction is 55-65% by visual estimate.   1.  Widely patent RCA stents with no significant restenosis, stable disease in the proximal, mid and distal LAD, Known chronically occluded OM1 with collaterals from the right  coronary artery and progression of mid left circumflex stenosis to 80% with a minimum luminal area of 2.7 mm by IVUS and extensive plaque. 2.  Normal LV systolic function mildly elevated left ventricular end-diastolic pressure. 3.  Successful direct stenting of the mid left circumflex.   Recommendations: Dual antiplatelet therapy for at least 6 months. Continue aggressive treatment of risk factors. The LAD can be managed medically for now.  Coronary Diagrams  Diagnostic Dominance: Co-dominant  Intervention    Echo 2021 1. Left ventricular ejection fraction, by estimation, is 50 to 55%. The  left ventricle has low normal function. The left ventricle has no regional  wall motion abnormalities. There is mild left ventricular hypertrophy.  Left ventricular diastolic  parameters are consistent with Grade I diastolic dysfunction (impaired  relaxation).  2. Right ventricular systolic function is normal. The right ventricular  size is normal. There is normal pulmonary artery systolic pressure.   3. The mitral valve is normal in structure and function. Trivial mitral  valve regurgitation. No evidence of mitral stenosis.   4. The aortic valve is normal in structure and function. Aortic valve  regurgitation is not visualized. No aortic stenosis is present.  EKG:  EKG is *** ordered today.  The ekg ordered today demonstrates ***  Recent Labs: 01/03/2021: ALT 23 08/03/2021: TSH 5.835 08/26/2021: BUN 28; Creatinine, Ser 1.43; Hemoglobin 15.1; Platelets 301; Potassium 3.9; Sodium 137  Recent Lipid Panel    Component Value Date/Time   CHOL 174 08/03/2021 1426   CHOL 131 07/22/2019 0851   CHOL 155 07/23/2012 0909   TRIG 59 08/03/2021 1426   TRIG 97 07/23/2012 0909   HDL 45 08/03/2021 1426   HDL 45 07/22/2019 0851   HDL 43 07/23/2012 0909   CHOLHDL 3.9 08/03/2021 1426   VLDL 12 08/03/2021 1426   LDLCALC 117 (H) 08/03/2021 1426   LDLCALC 71 07/22/2019 0851   LDLCALC 93 07/23/2012 0909      Risk Assessment/Calculations:   {Does this patient have ATRIAL FIBRILLATION?:[id number]}   Physical Exam:    VS:  There were no vitals taken for this visit.    Wt Readings from Last 3 Encounters:  08/24/21 227 lb (103 kg)  08/23/21 233 lb 2 oz (105.7 kg)  08/03/21 235 lb (106.6 kg)     GEN: *** Well nourished, well developed in no acute distress HEENT: Normal NECK: No JVD; No carotid bruits LYMPHATICS: No lymphadenopathy CARDIAC: ***RRR, no murmurs, rubs, gallops RESPIRATORY:  Clear to auscultation without rales, wheezing or rhonchi  ABDOMEN: Soft, non-tender, non-distended MUSCULOSKELETAL:  No edema; No deformity  SKIN: Warm and dry NEUROLOGIC:  Alert and oriented x 3 PSYCHIATRIC:  Normal affect   ASSESSMENT:    No diagnosis found. PLAN:    In order of problems listed above:  Abnormal treadmill test DOE CAD with DES x2 RCA in 2021  HTN  HLD    Disposition: Follow up {follow up:15908} with ***   Shared Decision Making/Informed Consent   {Are you ordering a CV Procedure (e.g. stress test, cath, DCCV, TEE, etc)?   Press F2        :[id number]}    Signed, Jasiah Buntin David Stall, PA-C  09/10/2021 3:46 PM    [facility]

## 2021-09-11 ENCOUNTER — Other Ambulatory Visit: Payer: 59

## 2021-09-11 ENCOUNTER — Ambulatory Visit: Payer: 59 | Admitting: Medical

## 2021-10-04 ENCOUNTER — Ambulatory Visit: Payer: 59 | Admitting: Medical

## 2021-11-12 ENCOUNTER — Ambulatory Visit: Payer: 59 | Attending: Medical | Admitting: Medical

## 2021-11-12 ENCOUNTER — Encounter: Payer: Self-pay | Admitting: Medical

## 2021-11-12 ENCOUNTER — Other Ambulatory Visit: Payer: Self-pay

## 2021-11-12 VITALS — BP 138/90 | HR 67 | Ht 75.0 in | Wt 232.6 lb

## 2021-11-12 DIAGNOSIS — I2510 Atherosclerotic heart disease of native coronary artery without angina pectoris: Secondary | ICD-10-CM | POA: Diagnosis not present

## 2021-11-12 DIAGNOSIS — E782 Mixed hyperlipidemia: Secondary | ICD-10-CM | POA: Diagnosis not present

## 2021-11-12 DIAGNOSIS — E785 Hyperlipidemia, unspecified: Secondary | ICD-10-CM

## 2021-11-12 DIAGNOSIS — I10 Essential (primary) hypertension: Secondary | ICD-10-CM

## 2021-11-12 NOTE — Patient Instructions (Signed)
Medication Instructions:  Your physician recommends that you continue on your current medications as directed. Please refer to the Current Medication list given to you today.  *If you need a refill on your cardiac medications before your next appointment, please call your pharmacy*   Lab Work: None ordered  If you have labs (blood work) drawn today and your tests are completely normal, you will receive your results only by: MyChart Message (if you have MyChart) OR A paper copy in the mail If you have any lab test that is abnormal or we need to change your treatment, we will call you to review the results.   Testing/Procedures: None ordered   Follow-Up: At [facility], you and your health needs are our priority.  As part of our continuing mission to provide you with exceptional heart care, we have created designated Provider Care Teams.  These Care Teams include your primary Cardiologist (physician) and Advanced Practice Providers (APPs -  Physician Assistants and Nurse Practitioners) who all work together to provide you with the care you need, when you need it.  We recommend signing up for the patient portal called "MyChart".  Sign up information is provided on this After Visit Summary.  MyChart is used to connect with patients for Virtual Visits (Telemedicine).  Patients are able to view lab/test results, encounter notes, upcoming appointments, etc.  Non-urgent messages can be sent to your provider as well.   To learn more about what you can do with MyChart, go to [email or web address].    Your next appointment:   6 month(s)  The format for your next appointment:   In Person  Provider:   You may see Muhammad Arida, MD or one of the following Advanced Practice Providers on your designated Care Team:   Christopher Berge, NP Ryan Dunn, PA-C Cadence Furth, PA-C Sheri Hammock, NP   Other Instructions N/A  Important Information About Sugar       

## 2021-11-12 NOTE — Progress Notes (Signed)
Cardiology Office Note:    Date:  11/12/2021   ID:  Jackson Williamson, DOB 19-Nov-1955, MRN [id number]  PCP:  Eustaquio Boyden, MD  [facility] Cardiologist:  Lorine Bears, MD  [facility] Electrophysiologist:  None   Referring MD: Eustaquio Boyden, MD   Chief Complaint: Hospital follow-up  History of Present Illness:    Jackson Williamson is a 66 y.o. male with a hx of CAD s/p DESx2 RCA in 2021, HLD, HTN, and hypothyroidism who presented for cardiac catheterization.   He was seen 08/03/21 and reported worsening dyspnea on exertion and a treadmill test was ordered. This showed down sloping ST depression of the inferior leads and he was brought back in the office. He was seen again 08/23/21 and was set up for outpatient left heart catheterization.   The patient underwent cardiac cath as noted above with patent RCA stents with no significant stenosis, progression of mid left circumflex stenosis to 80% which was significant by IVUS whih was treated with stent placement. There was 50% pLAD stenosis and 70% distal LAD stenosis which was left to be treated medically. Plan for DAPT with ASA/Plavix for at least 6 month.  There were no observed complications post cath. Radial right cath site was re-evaluated prior to discharge and found to be stable without any complications. Instructions/precautions regarding cath site care were given prior to discharge.  ER visit 08/26/21 for chest pain lasting a couple hours and felt foggy and sweaty. Patient took ASA. Troponin was mildly elevated to 129. The patient left AMA because he felt it was psycho-somatic chest pain.   Today, the patient reports he is overall doing well. He denies further chest pain. He denies SOB. Patient is back to normal activity. He is back working out. He denies LLE, orthopnea, or pnd. He is taking ASA and Plavix, he noted he bleeds longer with a cut. EKG stable today.   Past Medical History:  Diagnosis Date   3-vessel CAD    LHC  05/03/2019: Sig 3v CAD, dRCA 100% /pRCA 70% RCA --> p/dRCA with DES, OM1 99% probed, mLCx 60%, mLAD 70%, dLAD 90%, mild elv LVEDP,    Diastolic dysfunction    3/1 echo EF 50-55%, G1DD   ED (erectile dysfunction)    GERD (gastroesophageal reflux disease)    related to generic levothyroxine   History of chicken pox    Hyperlipidemia    Hypothyroidism     Past Surgical History:  Procedure Laterality Date   CORONARY STENT INTERVENTION N/A 08/24/2021   Procedure: CORONARY STENT INTERVENTION;  Surgeon: Iran Ouch, MD;  Location: [facility] INVASIVE CV LAB;  Service: Cardiovascular;  Laterality: N/A;   CORONARY/GRAFT ACUTE MI REVASCULARIZATION N/A 05/03/2019   Procedure: Coronary/Graft Acute MI Revascularization;  Surgeon: Iran Ouch, MD;  Location: [facility] INVASIVE CV LAB;  Service: Cardiovascular;  Laterality: N/A;   DOBUTAMINE STRESS ECHO  2013   normal per patient   HERNIA REPAIR Right 05/2012   RIH and umbilical   INTRAVASCULAR ULTRASOUND/IVUS N/A 08/24/2021   Procedure: Intravascular Ultrasound/IVUS;  Surgeon: Iran Ouch, MD;  Location: [facility] INVASIVE CV LAB;  Service: Cardiovascular;  Laterality: N/A;   KNEE SURGERY  [phone]   left knee   LEFT HEART CATH AND CORONARY ANGIOGRAPHY N/A 05/03/2019   Procedure: LEFT HEART CATH AND CORONARY ANGIOGRAPHY;  Surgeon: Iran Ouch, MD;  Location: [facility] INVASIVE CV LAB;  Service: Cardiovascular;  Laterality: N/A;   LEFT HEART CATH AND CORONARY ANGIOGRAPHY Left 08/24/2021  Procedure: LEFT HEART CATH AND CORONARY ANGIOGRAPHY;  Surgeon: Iran Ouch, MD;  Location: [facility] INVASIVE CV LAB;  Service: Cardiovascular;  Laterality: Left;    Current Medications: Current Meds  Medication Sig   amLODipine (NORVASC) 5 MG tablet Take 1 tablet (5 mg total) by mouth daily.   aspirin 81 MG tablet Take 81 mg by mouth daily.    Cholecalciferol (VITAMIN D3) LIQD 4,000 Units by Does not apply route daily. Take 4 drops daily   clopidogrel  (PLAVIX) 75 MG tablet Take 1 tablet (75 mg total) by mouth daily.   Coenzyme Q10 (CO Q-10) 100 MG CAPS Take 1 capsule by mouth daily.   Cyanocobalamin (VITAMIN B12 PO) Take 2,500 mcg by mouth daily. Takes 1/2 dropper   levothyroxine (SYNTHROID) 112 MCG tablet Take 1 tablet (112 mcg total) by mouth daily.   nitroGLYCERIN (NITROSTAT) 0.4 MG SL tablet Place 1 tablet (0.4 mg total) under the tongue every 5 (five) minutes as needed for chest pain.   rosuvastatin (CRESTOR) 40 MG tablet Take 1 tablet (40 mg total) by mouth daily at 6 PM.     Allergies:   Levothyroxine   Social History   Socioeconomic History   Marital status: Single    Spouse name: Not on file   Number of children: Not on file   Years of education: Not on file   Highest education level: Not on file  Occupational History   Not on file  Tobacco Use   Smoking status: Never   Smokeless tobacco: Former    Types: Snuff    Quit date: 03/18/2008   Tobacco comments:    social as youth  Substance and Sexual Activity   Alcohol use: No   Drug use: No   Sexual activity: Not Currently  Other Topics Concern   Not on file  Social History Narrative   Divorced, 1 dog (Advertising account planner)   Grown daughter   Occupation: Museum/gallery curator   Edu: BS   Activity: runs, bikes, swims, cross fit 5-6 d/wk   Diet: good water, fruits/vegetables daily, "80% paleo"   Social Determinants of Health   Financial Resource Strain: Not on file  Food Insecurity: Not on file  Transportation Needs: Not on file  Physical Activity: Not on file  Stress: Not on file  Social Connections: Not on file     Family History: The patient's family history includes CAD (age of onset: 44) in his brother; CAD (age of onset: 68) in his mother; CAD (age of onset: 64) in his father; Cancer (age of onset: 14) in his cousin; Cancer (age of onset: 63) in his maternal uncle; Cancer (age of onset: 33) in his maternal aunt; Cancer (age of onset: 74) in his maternal uncle;  Heart attack in his maternal grandfather and mother; High Cholesterol in his sister; Hyperlipidemia in his brother, father, and mother; Hypertension in his father; Stroke in his maternal grandfather.  ROS:   Please see the history of present illness.     All other systems reviewed and are negative.  EKGs/Labs/Other Studies Reviewed:    The following studies were reviewed today:  Cardiac cath 6.23.23     Dist LAD-1 lesion is 70% stenosed.   Prox LAD to Mid LAD lesion is 40% stenosed.   2nd Diag lesion is 50% stenosed.   1st Diag lesion is 60% stenosed.   1st Mrg lesion is 100% stenosed.   Mid Cx lesion is 80% stenosed.   Dist LAD-2 lesion is 80%  stenosed.   Non-stenotic Dist RCA lesion was previously treated.   Non-stenotic Prox RCA lesion was previously treated.   A drug-eluting stent was successfully placed using a STENT ONYX FRONTIER 4.0X18.   Post intervention, there is a 0% residual stenosis.   The left ventricular systolic function is normal.   LV end diastolic pressure is mildly elevated.   The left ventricular ejection fraction is 55-65% by visual estimate.   1.  Widely patent RCA stents with no significant restenosis, stable disease in the proximal, mid and distal LAD, Known chronically occluded OM1 with collaterals from the right coronary artery and progression of mid left circumflex stenosis to 80% with a minimum luminal area of 2.7 mm by IVUS and extensive plaque. 2.  Normal LV systolic function mildly elevated left ventricular end-diastolic pressure. 3.  Successful direct stenting of the mid left circumflex.   Recommendations: Dual antiplatelet therapy for at least 6 months. Continue aggressive treatment of risk factors. The LAD can be managed medically for now.  Echo 05/2019  1. Left ventricular ejection fraction, by estimation, is 50 to 55%. The  left ventricle has low normal function. The left ventricle has no regional  wall motion abnormalities. There is mild  left ventricular hypertrophy.  Left ventricular diastolic  parameters are consistent with Grade I diastolic dysfunction (impaired  relaxation).   2. Right ventricular systolic function is normal. The right ventricular  size is normal. There is normal pulmonary artery systolic pressure.   3. The mitral valve is normal in structure and function. Trivial mitral  valve regurgitation. No evidence of mitral stenosis.   4. The aortic valve is normal in structure and function. Aortic valve  regurgitation is not visualized. No aortic stenosis is present.    EKG:  EKG is ordered today.  The ekg ordered today demonstrates NSR, NSR 67bpm, diffuse TWI  Recent Labs: 01/03/2021: ALT 23 08/03/2021: TSH 5.835 08/26/2021: BUN 28; Creatinine, Ser 1.43; Hemoglobin 15.1; Platelets 301; Potassium 3.9; Sodium 137  Recent Lipid Panel    Component Value Date/Time   CHOL 174 08/03/2021 1426   CHOL 131 07/22/2019 0851   CHOL 155 07/23/2012 0909   TRIG 59 08/03/2021 1426   TRIG 97 07/23/2012 0909   HDL 45 08/03/2021 1426   HDL 45 07/22/2019 0851   HDL 43 07/23/2012 0909   CHOLHDL 3.9 08/03/2021 1426   VLDL 12 08/03/2021 1426   LDLCALC 117 (H) 08/03/2021 1426   LDLCALC 71 07/22/2019 0851   LDLCALC 93 07/23/2012 0909    Physical Exam:    VS:  BP (!) 138/90 (BP Location: Left Arm, Patient Position: Sitting, Cuff Size: Normal)   Pulse 67   Ht 6\' 3"  (1.905 m)   Wt 232 lb 9.6 oz (105.5 kg)   SpO2 97%   BMI 29.07 kg/m     Wt Readings from Last 3 Encounters:  11/12/21 232 lb 9.6 oz (105.5 kg)  08/24/21 227 lb (103 kg)  08/23/21 233 lb 2 oz (105.7 kg)     GEN:  Well nourished, well developed in no acute distress HEENT: Normal NECK: No JVD; No carotid bruits LYMPHATICS: No lymphadenopathy CARDIAC: RRR, no murmurs, rubs, gallops RESPIRATORY:  Clear to auscultation without rales, wheezing or rhonchi  ABDOMEN: Soft, non-tender, non-distended MUSCULOSKELETAL:  No edema; No deformity  SKIN: Warm and  dry NEUROLOGIC:  Alert and oriented x 3 PSYCHIATRIC:  Normal affect   ASSESSMENT:    1. Coronary artery disease involving native coronary artery of native  heart without angina pectoris   2. Essential hypertension   3. Hyperlipidemia, mixed    PLAN:    In order of problems listed above:  CAD s/p DES x2 RCA in 2021 and DES pLAD 08/2021 Patient is overall doing well. He is back working out. He denies anginal symptoms. EKG NSR with no changes. No further ischemic work-up indicated. Continue DAPT with Aspirin and Plavix. Continue Crestor 40mg  daily.   HTN BP is mildly elevated. Continue amlodipine 5mg  daily. Can consider addition of BB at follow-up.  HLD We will re-check a fasting lipid, the last on he says he ate beforehand. Continue Crestor 40mg  daily.   Disposition: Follow up in 6 month(s) with MD/APP    Signed, Rashan Patient , PA-C  11/12/2021 2:16 PM    [facility]

## 2022-01-04 ENCOUNTER — Ambulatory Visit (INDEPENDENT_AMBULATORY_CARE_PROVIDER_SITE_OTHER): Payer: 59 | Admitting: Family Medicine

## 2022-01-04 ENCOUNTER — Encounter: Payer: Self-pay | Admitting: Family Medicine

## 2022-01-04 VITALS — BP 124/80 | HR 57 | Temp 97.3°F | Ht 74.5 in | Wt 235.2 lb

## 2022-01-04 DIAGNOSIS — I10 Essential (primary) hypertension: Secondary | ICD-10-CM | POA: Diagnosis not present

## 2022-01-04 DIAGNOSIS — I2510 Atherosclerotic heart disease of native coronary artery without angina pectoris: Secondary | ICD-10-CM | POA: Diagnosis not present

## 2022-01-04 DIAGNOSIS — E785 Hyperlipidemia, unspecified: Secondary | ICD-10-CM

## 2022-01-04 DIAGNOSIS — Z125 Encounter for screening for malignant neoplasm of prostate: Secondary | ICD-10-CM | POA: Diagnosis not present

## 2022-01-04 DIAGNOSIS — E039 Hypothyroidism, unspecified: Secondary | ICD-10-CM

## 2022-01-04 DIAGNOSIS — Z0000 Encounter for general adult medical examination without abnormal findings: Secondary | ICD-10-CM | POA: Diagnosis not present

## 2022-01-04 DIAGNOSIS — Z1211 Encounter for screening for malignant neoplasm of colon: Secondary | ICD-10-CM | POA: Diagnosis not present

## 2022-01-04 DIAGNOSIS — Z8249 Family history of ischemic heart disease and other diseases of the circulatory system: Secondary | ICD-10-CM

## 2022-01-04 LAB — COMPREHENSIVE METABOLIC PANEL
ALT: 22 U/L (ref 0–53)
AST: 17 U/L (ref 0–37)
Albumin: 4.2 g/dL (ref 3.5–5.2)
Alkaline Phosphatase: 50 U/L (ref 39–117)
BUN: 21 mg/dL (ref 6–23)
CO2: 26 mEq/L (ref 19–32)
Calcium: 9.5 mg/dL (ref 8.4–10.5)
Chloride: 104 mEq/L (ref 96–112)
Creatinine, Ser: 1.06 mg/dL (ref 0.40–1.50)
GFR: 74.44 mL/min (ref >60.00)
Glucose, Bld: 107 mg/dL — ABNORMAL HIGH (ref 70–99)
Potassium: 4.6 mEq/L (ref 3.5–5.1)
Sodium: 137 mEq/L (ref 135–145)
Total Bilirubin: 0.4 mg/dL (ref 0.2–1.2)
Total Protein: 6.7 g/dL (ref 6.0–8.3)

## 2022-01-04 LAB — LIPID PANEL
Cholesterol: 167 mg/dL (ref 0–200)
HDL: 48.4 mg/dL (ref >39.00)
LDL Cholesterol: 105 mg/dL — ABNORMAL HIGH (ref 0–99)
NonHDL: 118.68
Total CHOL/HDL Ratio: 3
Triglycerides: 67 mg/dL (ref 0.0–149.0)
VLDL: 13.4 mg/dL (ref 0.0–40.0)

## 2022-01-04 LAB — CBC WITH DIFFERENTIAL/PLATELET
Basophils Absolute: 0.1 10*3/uL (ref 0.0–0.1)
Basophils Relative: 1 % (ref 0.0–3.0)
Eosinophils Absolute: 0.1 10*3/uL (ref 0.0–0.7)
Eosinophils Relative: 1 % (ref 0.0–5.0)
HCT: 45.7 % (ref 39.0–52.0)
Hemoglobin: 15.2 g/dL (ref 13.0–17.0)
Lymphocytes Relative: 23.6 % (ref 12.0–46.0)
Lymphs Abs: 1.5 10*3/uL (ref 0.7–4.0)
MCHC: 33.3 g/dL (ref 30.0–36.0)
MCV: 89.9 fl (ref 78.0–100.0)
Monocytes Absolute: 0.5 10*3/uL (ref 0.1–1.0)
Monocytes Relative: 8.2 % (ref 3.0–12.0)
Neutro Abs: 4.3 10*3/uL (ref 1.4–7.7)
Neutrophils Relative %: 66.2 % (ref 43.0–77.0)
Platelets: 327 10*3/uL (ref 150.0–400.0)
RBC: 5.08 Mil/uL (ref 4.22–5.81)
RDW: 14.1 % (ref 11.5–15.5)
WBC: 6.5 10*3/uL (ref 4.0–10.5)

## 2022-01-04 LAB — MICROALBUMIN / CREATININE URINE RATIO
Creatinine,U: 107.8 mg/dL
Microalb Creat Ratio: 0.6 mg/g (ref 0.0–30.0)
Microalb, Ur: <0.7 mg/dL (ref 0.0–1.9)

## 2022-01-04 LAB — TSH: TSH: 5.52 u[IU]/mL — ABNORMAL HIGH (ref 0.35–5.50)

## 2022-01-04 LAB — PSA: PSA: 0.4 ng/mL (ref 0.10–4.00)

## 2022-01-04 NOTE — Assessment & Plan Note (Signed)
Preventative protocols reviewed and updated unless pt declined. Discussed healthy diet and lifestyle.  

## 2022-01-04 NOTE — Assessment & Plan Note (Addendum)
Update TSH on generic levothyroxine 125mcg daily which he's been tolerating well.

## 2022-01-04 NOTE — Assessment & Plan Note (Signed)
Regularly sees cardiology 

## 2022-01-04 NOTE — Assessment & Plan Note (Signed)
Stable period - has been able to control with regular activity at gym and without antihypertensive.

## 2022-01-04 NOTE — Progress Notes (Signed)
Patient ID: Jackson Williamson, male    DOB: [birth year], 66 y.o.   MRN: [id number]  This visit was conducted in person.  BP 124/80   Pulse (!) 57   Temp (!) 97.3 F (36.3 C) (Temporal)   Ht 6' 2.5" (1.892 m)   Wt 235 lb 3.2 oz (106.7 kg)   SpO2 98%   BMI 29.79 kg/m   Pulse Readings from Last 3 Encounters:  01/04/22 (!) 57  11/12/21 67  08/26/21 65   CC: CPE Subjective:   HPI: Jackson Williamson is a 66 y.o. male presenting on 01/04/2022 for Annual Exam   Inferior STEMI 05/2019 s/p cath and PCI with DESx2 to RCA on crestor, aspirin. Completed DAPT x12 months with Effient. S/p DES to pLAD 08/2021, now on aspirin/plavix. LDL goal <70.    Hypothyroidism - on 158mg daily brand Synthroid (generic caused worsening GERD). last year retried generic due to cost - tolerating well.   He's been going to gym regularly and has been able to stop BP medicine months ago.   Preventative: Colon cancer screening - aunt with colon cancer at age 64 iFOB normal yearly Prostate cancer screening - checked yearly due to fmhx (uncle). No nocturia or BPH symptoms.  Lung cancer screening - not eligible  Flu shot - declines COVID vaccine - discussed - declines  Tetanus - 2006. Declines for now  Pneumococcal - declines Shingrix - declines  Seat belt use discussed  Sunscreen use discussed, no changing moles  Sleep - averaging 7 hours/night  Non smoker  Alcohol - rare Dentist q6 mo  Eye exam - has not seen    Lives with daughter, 1 dog (gRestaurant manager, fast food  Occupation: cEditor, commissioning Edu: BS  Activity: regularly active at gym 4-5x/wk (Musician Diet: good water, fruits/vegetables daily, "80% paleo"      Relevant past medical, surgical, family and social history reviewed and updated as indicated. Interim medical history since our last visit reviewed. Allergies and medications reviewed and updated. Outpatient Medications Prior to Visit  Medication Sig Dispense Refill   amLODipine (NORVASC) 5 MG  tablet Take 1 tablet (5 mg total) by mouth daily. 180 tablet 3   aspirin 81 MG tablet Take 81 mg by mouth daily.      Cholecalciferol (VITAMIN D3) LIQD 4,000 Units by Does not apply route daily. Take 4 drops daily     clopidogrel (PLAVIX) 75 MG tablet Take 1 tablet (75 mg total) by mouth daily. 90 tablet 3   Coenzyme Q10 (CO Q-10) 100 MG CAPS Take 1 capsule by mouth daily.  0   Cyanocobalamin (VITAMIN B12 PO) Take 2,500 mcg by mouth daily. Takes 1/2 dropper     levothyroxine (SYNTHROID) 112 MCG tablet Take 1 tablet (112 mcg total) by mouth daily. 90 tablet 2   nitroGLYCERIN (NITROSTAT) 0.4 MG SL tablet Place 1 tablet (0.4 mg total) under the tongue every 5 (five) minutes as needed for chest pain. 25 tablet 3   rosuvastatin (CRESTOR) 40 MG tablet Take 1 tablet (40 mg total) by mouth daily at 6 PM. 90 tablet 3   No facility-administered medications prior to visit.     Per HPI unless specifically indicated in ROS section below Review of Systems  Constitutional:  Negative for activity change, appetite change, chills, fatigue, fever and unexpected weight change.  HENT:  Negative for hearing loss.   Eyes:  Negative for visual disturbance.  Respiratory:  Negative for cough, chest tightness, shortness of  breath and wheezing.   Cardiovascular:  Negative for chest pain, palpitations and leg swelling.  Gastrointestinal:  Negative for abdominal distention, abdominal pain, blood in stool, constipation, diarrhea, nausea and vomiting.  Genitourinary:  Negative for difficulty urinating and hematuria.  Musculoskeletal:  Negative for arthralgias, myalgias and neck pain.  Skin:  Negative for rash.  Neurological:  Negative for dizziness, seizures, syncope and headaches.  Hematological:  Negative for adenopathy. Bruises/bleeds easily.  Psychiatric/Behavioral:  Negative for dysphoric mood. The patient is not nervous/anxious.     Objective:  BP 124/80   Pulse (!) 57   Temp (!) 97.3 F (36.3 C) (Temporal)    Ht 6' 2.5" (1.892 m)   Wt 235 lb 3.2 oz (106.7 kg)   SpO2 98%   BMI 29.79 kg/m   Wt Readings from Last 3 Encounters:  01/04/22 235 lb 3.2 oz (106.7 kg)  11/12/21 232 lb 9.6 oz (105.5 kg)  08/24/21 227 lb (103 kg)      Physical Exam Vitals and nursing note reviewed.  Constitutional:      General: He is not in acute distress.    Appearance: Normal appearance. He is well-developed. He is not ill-appearing.  HENT:     Head: Normocephalic and atraumatic.     Right Ear: Hearing, tympanic membrane, ear canal and external ear normal.     Left Ear: Hearing, tympanic membrane, ear canal and external ear normal.  Eyes:     General: No scleral icterus.    Extraocular Movements: Extraocular movements intact.     Conjunctiva/sclera: Conjunctivae normal.     Pupils: Pupils are equal, round, and reactive to light.  Neck:     Thyroid: No thyroid mass or thyromegaly.     Vascular: No carotid bruit.  Cardiovascular:     Rate and Rhythm: Normal rate and regular rhythm.     Pulses: Normal pulses.          Radial pulses are 2+ on the right side and 2+ on the left side.     Heart sounds: Normal heart sounds. No murmur heard. Pulmonary:     Effort: Pulmonary effort is normal. No respiratory distress.     Breath sounds: Normal breath sounds. No wheezing, rhonchi or rales.  Abdominal:     General: Bowel sounds are normal. There is no distension.     Palpations: Abdomen is soft. There is no mass.     Tenderness: There is no abdominal tenderness. There is no guarding or rebound.     Hernia: No hernia is present.  Musculoskeletal:        General: Normal range of motion.     Cervical back: Normal range of motion and neck supple.     Right lower leg: No edema.     Left lower leg: No edema.  Lymphadenopathy:     Cervical: No cervical adenopathy.  Skin:    General: Skin is warm and dry.     Findings: No rash.  Neurological:     General: No focal deficit present.     Mental Status: He is alert  and oriented to person, place, and time.  Psychiatric:        Mood and Affect: Mood normal.        Behavior: Behavior normal.        Thought Content: Thought content normal.        Judgment: Judgment normal.       Results for orders placed or performed during the hospital encounter  of 62/86/38  Basic metabolic panel  Result Value Ref Range   Sodium 137 135 - 145 mmol/L   Potassium 3.9 3.5 - 5.1 mmol/L   Chloride 110 98 - 111 mmol/L   CO2 20 (L) 22 - 32 mmol/L   Glucose, Bld 104 (H) 70 - 99 mg/dL   BUN 28 (H) 8 - 23 mg/dL   Creatinine, Ser 1.43 (H) 0.61 - 1.24 mg/dL   Calcium 9.2 8.9 - 10.3 mg/dL   GFR, Estimated 55 (L) >60 mL/min   Anion gap 7 5 - 15  CBC  Result Value Ref Range   WBC 8.4 4.0 - 10.5 K/uL   RBC 5.13 4.22 - 5.81 MIL/uL   Hemoglobin 15.1 13.0 - 17.0 g/dL   HCT 45.6 39.0 - 52.0 %   MCV 88.9 80.0 - 100.0 fL   MCH 29.4 26.0 - 34.0 pg   MCHC 33.1 30.0 - 36.0 g/dL   RDW 13.2 11.5 - 15.5 %   Platelets 301 150 - 400 K/uL   nRBC 0.0 0.0 - 0.2 %  Troponin I (High Sensitivity)  Result Value Ref Range   Troponin I (High Sensitivity) 129 (HH) <18 ng/L    Assessment & Plan:   Problem List Items Addressed This Visit     Healthcare maintenance - Primary (Chronic)    Preventative protocols reviewed and updated unless pt declined. Discussed healthy diet and lifestyle.       Hyperlipidemia    Continues crestor - update labs. The ASCVD Risk score (Arnett DK, et al., 2019) failed to calculate for the following reasons:   The patient has a prior MI or stroke diagnosis       Relevant Orders   Lipid panel   Comprehensive metabolic panel   Hypothyroidism    Update TSH on generic levothyroxine 120mg daily which he's been tolerating well.       Relevant Orders   TSH   CAD (coronary artery disease)    Regularly sees cardiology.       Family history of heart disease   Hypertension    Stable period - has been able to control with regular activity at gym and  without antihypertensive.       Relevant Orders   CBC with Differential/Platelet   Microalbumin / creatinine urine ratio   Other Visit Diagnoses     Special screening for malignant neoplasm of prostate       Relevant Orders   PSA   Special screening for malignant neoplasms, colon       Relevant Orders   Fecal occult blood, imunochemical        No orders of the defined types were placed in this encounter.  Orders Placed This Encounter  Procedures   Fecal occult blood, imunochemical    Standing Status:   Future    Standing Expiration Date:   01/05/2023   Lipid panel   Comprehensive metabolic panel   PSA   CBC with Differential/Platelet   Microalbumin / creatinine urine ratio   TSH     Patient instructions: Labs today  Pass by lab to pick up stool kit  Good to see you today Return as needed or in 1 year for next physical.   Follow up plan: Return in about 1 year (around 01/05/2023) for annual exam, prior fasting for blood work.  JRia Bush MD

## 2022-01-04 NOTE — Patient Instructions (Addendum)
Labs today  Pass by lab to pick up stool kit  Good to see you today Return as needed or in 1 year for next physical.   Health Maintenance, Male Adopting a healthy lifestyle and getting preventive care are important in promoting health and wellness. Ask your health care provider about: The right schedule for you to have regular tests and exams. Things you can do on your own to prevent diseases and keep yourself healthy. What should I know about diet, weight, and exercise? Eat a healthy diet  Eat a diet that includes plenty of vegetables, fruits, low-fat dairy products, and lean protein. Do not eat a lot of foods that are high in solid fats, added sugars, or sodium. Maintain a healthy weight Body mass index (BMI) is a measurement that can be used to identify possible weight problems. It estimates body fat based on height and weight. Your health care provider can help determine your BMI and help you achieve or maintain a healthy weight. Get regular exercise Get regular exercise. This is one of the most important things you can do for your health. Most adults should: Exercise for at least 150 minutes each week. The exercise should increase your heart rate and make you sweat (moderate-intensity exercise). Do strengthening exercises at least twice a week. This is in addition to the moderate-intensity exercise. Spend less time sitting. Even light physical activity can be beneficial. Watch cholesterol and blood lipids Have your blood tested for lipids and cholesterol at 64 years of age, then have this test every 5 years. You may need to have your cholesterol levels checked more often if: Your lipid or cholesterol levels are high. You are older than 64 years of age. You are at high risk for heart disease. What should I know about cancer screening? Many types of cancers can be detected early and may often be prevented. Depending on your health history and family history, you may need to have cancer  screening at various ages. This may include screening for: Colorectal cancer. Prostate cancer. Skin cancer. Lung cancer. What should I know about heart disease, diabetes, and high blood pressure? Blood pressure and heart disease High blood pressure causes heart disease and increases the risk of stroke. This is more likely to develop in people who have high blood pressure readings or are overweight. Talk with your health care provider about your target blood pressure readings. Have your blood pressure checked: Every 3-5 years if you are 76-59 years of age. Every year if you are 22 years old or older. If you are between the ages of 16 and 39 and are a current or former smoker, ask your health care provider if you should have a one-time screening for abdominal aortic aneurysm (AAA). Diabetes Have regular diabetes screenings. This checks your fasting blood sugar level. Have the screening done: Once every three years after age 6 if you are at a normal weight and have a low risk for diabetes. More often and at a younger age if you are overweight or have a high risk for diabetes. What should I know about preventing infection? Hepatitis B If you have a higher risk for hepatitis B, you should be screened for this virus. Talk with your health care provider to find out if you are at risk for hepatitis B infection. Hepatitis C Blood testing is recommended for: Everyone born from [birth year] through 1963. Anyone with known risk factors for hepatitis C. Sexually transmitted infections (STIs) You should be screened each year  for STIs, including gonorrhea and chlamydia, if: You are sexually active and are younger than 64 years of age. You are older than 64 years of age and your health care provider tells you that you are at risk for this type of infection. Your sexual activity has changed since you were last screened, and you are at increased risk for chlamydia or gonorrhea. Ask your health care provider if  you are at risk. Ask your health care provider about whether you are at high risk for HIV. Your health care provider may recommend a prescription medicine to help prevent HIV infection. If you choose to take medicine to prevent HIV, you should first get tested for HIV. You should then be tested every 3 months for as long as you are taking the medicine. Follow these instructions at home: Alcohol use Do not drink alcohol if your health care provider tells you not to drink. If you drink alcohol: Limit how much you have to 0-2 drinks a day. Know how much alcohol is in your drink. In the [place] one drink equals one 12 oz bottle of beer (355 mL), one 5 oz glass of wine (148 mL), or one 1 oz glass of hard liquor (44 mL). Lifestyle Do not use any products that contain nicotine or tobacco. These products include cigarettes, chewing tobacco, and vaping devices, such as e-cigarettes. If you need help quitting, ask your health care provider. Do not use street drugs. Do not share needles. Ask your health care provider for help if you need support or information about quitting drugs. General instructions Schedule regular health, dental, and eye exams. Stay current with your vaccines. Tell your health care provider if: You often feel depressed. You have ever been abused or do not feel safe at home. Summary Adopting a healthy lifestyle and getting preventive care are important in promoting health and wellness. Follow your health care provider's instructions about healthy diet, exercising, and getting tested or screened for diseases. Follow your health care provider's instructions on monitoring your cholesterol and blood pressure. This information is not intended to replace advice given to you by your health care provider. Make sure you discuss any questions you have with your health care provider. Document Revised: 07/10/2020 Document Reviewed: 07/10/2020 Elsevier Patient Education  [place].

## 2022-01-04 NOTE — Assessment & Plan Note (Signed)
Continues crestor - update labs. The ASCVD Risk score (Arnett DK, et al., 2019) failed to calculate for the following reasons:   The patient has a prior MI or stroke diagnosis

## 2022-01-05 ENCOUNTER — Other Ambulatory Visit: Payer: Self-pay | Admitting: Family Medicine

## 2022-01-05 MED ORDER — ROSUVASTATIN CALCIUM 40 MG PO TABS
40.0000 mg | ORAL_TABLET | Freq: Every day | ORAL | 3 refills | Status: DC
Start: 2022-01-05 — End: 2023-01-20

## 2022-01-05 MED ORDER — LEVOTHYROXINE SODIUM 125 MCG PO TABS
125.0000 ug | ORAL_TABLET | Freq: Every day | ORAL | 3 refills | Status: DC
Start: 2022-01-05 — End: 2023-01-07

## 2022-02-18 ENCOUNTER — Other Ambulatory Visit: Payer: Self-pay | Admitting: Cardiovascular Disease

## 2022-05-14 ENCOUNTER — Ambulatory Visit: Payer: 59 | Attending: Cardiovascular Disease | Admitting: Cardiovascular Disease

## 2022-05-14 ENCOUNTER — Encounter: Payer: Self-pay | Admitting: Cardiovascular Disease

## 2022-05-14 VITALS — BP 140/90 | HR 68 | Ht 75.0 in | Wt 231.5 lb

## 2022-05-14 DIAGNOSIS — I2510 Atherosclerotic heart disease of native coronary artery without angina pectoris: Secondary | ICD-10-CM | POA: Diagnosis not present

## 2022-05-14 DIAGNOSIS — E782 Mixed hyperlipidemia: Secondary | ICD-10-CM

## 2022-05-14 DIAGNOSIS — I10 Essential (primary) hypertension: Secondary | ICD-10-CM | POA: Diagnosis not present

## 2022-05-14 DIAGNOSIS — E039 Hypothyroidism, unspecified: Secondary | ICD-10-CM

## 2022-05-14 MED ORDER — EZETIMIBE 10 MG PO TABS: 10.0000 mg | ORAL_TABLET | Freq: Every day | ORAL | 3 refills | Status: DC

## 2022-05-14 NOTE — Progress Notes (Signed)
Cardiology Office Note   Date:  05/14/2022   ID:  Jackson Williamson, DOB [birth year], MRN [id number]  PCP:  Ria Bush, MD  Cardiologist:   Kathlyn Sacramento, MD   Chief Complaint  Patient presents with   Other    6 month f/u no complaints today. Meds reviewed verbally with pt.       History of Present Illness: Jackson Williamson is a 67 y.o. male who presents for a follow-up visit regarding coronary artery disease. He has known history of hyperlipidemia and hypothyroidism.  He presented in March 2021 with inferior ST elevation myocardial infarction.  Cardiac catheterization showed occluded distal right coronary artery with significant stenosis in the proximal vessel.  There was also chronic occlusion of OM1 and borderline significant disease in the mid left circumflex and mid LAD as well as distal LAD.  I performed successful angioplasty and drug-eluting stent placement to the distal and proximal right coronary artery.  OM1 was probed with 2 wires but could not be crossed indicating chronic occlusion.  Echocardiogram showed an EF of 50-55% with no wall motion abnormalities.  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    He had COVID-19 infection twice in 2023.  He was seen in June of last year with worsening exertional dyspnea.  He underwent a treadmill stress test which was abnormal.  Cardiac catheterization was done in June which showed widely patent RCA stent with no significant restenosis, stable disease in the proximal, mid and distal LAD, known chronically occluded OM1 with collaterals and progression of mid left circumflex stenosis to 80% with minimal luminal area of 2.7 mm by IVUS.  I performed stent placement to the mid left circumflex.  He has been doing well with no chest pain, shortness of breath or palpitations.  No side effects with medications.  Past Medical History:  Diagnosis Date   3-vessel CAD    LHC 05/03/2019: Sig 3v CAD, dRCA  100% /pRCA 70% RCA --> p/dRCA with DES, OM1 99% probed, mLCx 60%, mLAD 70%, dLAD 90%, mild elv LVEDP,    Diastolic dysfunction    3/1 echo EF 50-55%, G1DD   ED (erectile dysfunction)    GERD (gastroesophageal reflux disease)    related to generic levothyroxine   History of chicken pox    Hyperlipidemia    Hypothyroidism     Past Surgical History:  Procedure Laterality Date   CORONARY STENT INTERVENTION N/A 08/24/2021   Procedure: CORONARY STENT INTERVENTION;  Surgeon: Wellington Hampshire, MD;  Location: [facility];  Service: Cardiovascular;  Laterality: N/A;   CORONARY/GRAFT ACUTE MI REVASCULARIZATION N/A 05/03/2019   Procedure: Coronary/Graft Acute MI Revascularization;  Surgeon: Wellington Hampshire, MD;  Location: [facility];  Service: Cardiovascular;  Laterality: N/A;   DOBUTAMINE STRESS ECHO  2013   normal per patient   HERNIA REPAIR Right [id number]   RIH and umbilical   INTRAVASCULAR ULTRASOUND/IVUS N/A 08/24/2021   Procedure: Intravascular Ultrasound/IVUS;  Surgeon: Wellington Hampshire, MD;  Location: [facility];  Service: Cardiovascular;  Laterality: N/A;   KNEE SURGERY  [phone]   left knee   LEFT HEART CATH AND CORONARY ANGIOGRAPHY N/A 05/03/2019   Procedure: LEFT HEART CATH AND CORONARY ANGIOGRAPHY;  Surgeon: Wellington Hampshire, MD;  Location: [facility];  Service: Cardiovascular;  Laterality: N/A;   LEFT HEART CATH AND CORONARY ANGIOGRAPHY Left 08/24/2021   Procedure: LEFT HEART CATH AND  CORONARY ANGIOGRAPHY;  Surgeon: Wellington Hampshire, MD;  Location: [facility];  Service: Cardiovascular;  Laterality: Left;     Current Outpatient Medications  Medication Sig Dispense Refill   amLODipine (NORVASC) 5 MG tablet TAKE 1 TABLET BY MOUTH ONCE DAILY 180 tablet 0   aspirin 81 MG tablet Take 81 mg by mouth daily.      Cholecalciferol (VITAMIN D3) LIQD 4,000 Units by Does not apply route daily. Take 4 drops daily     clopidogrel (PLAVIX) 75  MG tablet Take 1 tablet (75 mg total) by mouth daily. 90 tablet 3   Coenzyme Q10 (CO Q-10) 100 MG CAPS Take 1 capsule by mouth daily.  0   Cyanocobalamin (VITAMIN B12 PO) Take 2,500 mcg by mouth daily. Takes 1/2 dropper     levothyroxine (SYNTHROID) 125 MCG tablet Take 1 tablet (125 mcg total) by mouth daily. 90 tablet 3   nitroGLYCERIN (NITROSTAT) 0.4 MG SL tablet Place 1 tablet (0.4 mg total) under the tongue every 5 (five) minutes as needed for chest pain. 25 tablet 3   rosuvastatin (CRESTOR) 40 MG tablet Take 1 tablet (40 mg total) by mouth daily at 6 PM. 90 tablet 3   No current facility-administered medications for this visit.    Allergies:   Patient has no active allergies.    Social History:  The patient  reports that he has never smoked. He quit smokeless tobacco use about 14 years ago.  His smokeless tobacco use included snuff. He reports that he does not drink alcohol and does not use drugs.   Family History:  The patient's family history includes CAD (age of onset: 21) in his brother; CAD (age of onset: 23) in his mother; CAD (age of onset: 37) in his father; Cancer (age of onset: 62) in his cousin; Cancer (age of onset: 35) in his maternal uncle; Cancer (age of onset: 49) in his maternal aunt; Cancer (age of onset: 87) in his maternal uncle; Heart attack in his maternal grandfather and mother; High Cholesterol in his sister; Hyperlipidemia in his brother, father, and mother; Hypertension in his father; Stroke in his maternal grandfather.    ROS:  Please see the history of present illness.   Otherwise, review of systems are positive for none.   All other systems are reviewed and negative.    PHYSICAL EXAM: VS:  BP (!) 140/90 (BP Location: Left Arm, Patient Position: Sitting, Cuff Size: Large)   Pulse 68   Ht '6\' 3"'$  (1.905 m)   Wt 231 lb 8 oz (105 kg)   SpO2 98%   BMI 28.94 kg/m  , BMI Body mass index is 28.94 kg/m. GEN: Well nourished, well developed, in no acute distress   HEENT: normal  Neck: no JVD, carotid bruits, or masses Cardiac: RRR; no murmurs, rubs, or gallops,no edema  Respiratory:  clear to auscultation bilaterally, normal work of breathing GI: soft, nontender, nondistended, + BS MS: no deformity or atrophy  Skin: warm and dry, no rash Neuro:  Strength and sensation are intact Psych: euthymic mood, full affect   EKG:  EKG is ordered today. The ekg ordered today demonstrates normal sinus rhythm with old inferior infarct.  No significant ST changes.   Recent Labs: 01/04/2022: ALT 22; BUN 21; Creatinine, Ser 1.06; Hemoglobin 15.2; Platelets 327.0; Potassium 4.6; Sodium 137; TSH 5.52    Lipid Panel    Component Value Date/Time   CHOL 167 01/04/2022 1012   CHOL 131 07/22/2019 0851  CHOL 155 07/23/2012 0909   TRIG 67.0 01/04/2022 1012   TRIG 97 07/23/2012 0909   HDL 48.40 01/04/2022 1012   HDL 45 07/22/2019 0851   HDL 43 07/23/2012 0909   CHOLHDL 3 01/04/2022 1012   VLDL 13.4 01/04/2022 1012   LDLCALC 105 (H) 01/04/2022 1012   LDLCALC 71 07/22/2019 0851   LDLCALC 93 07/23/2012 0909      Wt Readings from Last 3 Encounters:  05/14/22 231 lb 8 oz (105 kg)  01/04/22 235 lb 3.2 oz (106.7 kg)  11/12/21 232 lb 9.6 oz (105.5 kg)            No data to display            ASSESSMENT AND PLAN:  1.  Coronary artery disease involving native coronary arteries without angina: He is doing very well with no anginal symptoms.  Continue dual antiplatelet therapy for now given multiple stents and residual coronary artery disease.  He has residual LAD disease in multiple areas but currently with no anginal symptoms.  2.  Essential hypertension: His blood pressure has been well-controlled on amlodipine 5 mg once daily.  He was rushing to the appointment today and thus his blood pressure was slightly elevated..  3.  Hyperlipidemia: Continue high-dose rosuvastatin.  I reviewed most recent lipid profile which showed an LDL of 105.  I elected  to add ezetimibe 10 mg daily.  Recheck lipid and liver profile in 2 months.  4.  Hypothyroidism: Currently on levothyroxine.    Disposition:   FU with me in 6 months  Signed,  Kathlyn Sacramento, MD  05/14/2022 2:57 PM    [facility]

## 2022-05-14 NOTE — Patient Instructions (Signed)
Medication Instructions:  START Zetia 10 mg once daily  *If you need a refill on your cardiac medications before your next appointment, please call your pharmacy*   Lab Work: Your provider would like for you to return in 2 months to have the following labs drawn: Fasting Lipid and Liver.   Please go to the [facility] entrance and check in at the front desk.  You do not need an appointment.  They are open from 7am-6 pm.  You will need to be fasting.  If you have labs (blood work) drawn today and your tests are completely normal, you will receive your results only by: [place] (if you have MyChart) OR A paper copy in the mail If you have any lab test that is abnormal or we need to change your treatment, we will call you to review the results.   Testing/Procedures: None ordered   Follow-Up: At [facility], you and your health needs are our priority.  As part of our continuing mission to provide you with exceptional heart care, we have created designated Provider Care Teams.  These Care Teams include your primary Cardiologist (physician) and Advanced Practice Providers (APPs -  Physician Assistants and Nurse Practitioners) who all work together to provide you with the care you need, when you need it.  We recommend signing up for the patient portal called "MyChart".  Sign up information is provided on this After Visit Summary.  MyChart is used to connect with patients for Virtual Visits (Telemedicine).  Patients are able to view lab/test results, encounter notes, upcoming appointments, etc.  Non-urgent messages can be sent to your provider as well.   To learn more about what you can do with MyChart, go to [email or web address].    Your next appointment:   6 month(s)  Provider:   You may see Kathlyn Sacramento, MD or one of the following Advanced Practice Providers on your designated Care Team:   Murray Hodgkins, NP Christell Faith, PA-C Cadence Kathlen Mody, PA-C Gerrie Nordmann, NP

## 2022-09-12 ENCOUNTER — Other Ambulatory Visit: Payer: Self-pay | Admitting: Medical

## 2022-09-12 NOTE — Telephone Encounter (Signed)
This is a [facility] pt 

## 2022-09-17 ENCOUNTER — Other Ambulatory Visit: Payer: Self-pay | Admitting: Cardiovascular Disease

## 2022-09-19 ENCOUNTER — Encounter: Payer: Self-pay | Admitting: *Deleted

## 2022-12-19 ENCOUNTER — Other Ambulatory Visit: Payer: Self-pay | Admitting: Cardiovascular Disease

## 2022-12-19 NOTE — Telephone Encounter (Signed)
Please contact pt for future appointment. Pt overdue for 6 month f/u.  

## 2022-12-27 ENCOUNTER — Telehealth: Payer: Self-pay | Admitting: Cardiovascular Disease

## 2022-12-27 NOTE — Telephone Encounter (Signed)
Unable to leave voicemail, calling to schedule appt as requested by patient.

## 2022-12-27 NOTE — Telephone Encounter (Signed)
Left voicemail to schedule 6 month follow up appointment

## 2022-12-30 ENCOUNTER — Other Ambulatory Visit: Payer: Self-pay | Admitting: Family Medicine

## 2022-12-30 DIAGNOSIS — E785 Hyperlipidemia, unspecified: Secondary | ICD-10-CM

## 2022-12-30 DIAGNOSIS — I2510 Atherosclerotic heart disease of native coronary artery without angina pectoris: Secondary | ICD-10-CM

## 2022-12-30 DIAGNOSIS — Z125 Encounter for screening for malignant neoplasm of prostate: Secondary | ICD-10-CM

## 2022-12-30 DIAGNOSIS — E039 Hypothyroidism, unspecified: Secondary | ICD-10-CM

## 2022-12-31 ENCOUNTER — Other Ambulatory Visit (INDEPENDENT_AMBULATORY_CARE_PROVIDER_SITE_OTHER): Payer: 59

## 2022-12-31 DIAGNOSIS — Z125 Encounter for screening for malignant neoplasm of prostate: Secondary | ICD-10-CM | POA: Diagnosis not present

## 2022-12-31 DIAGNOSIS — E785 Hyperlipidemia, unspecified: Secondary | ICD-10-CM

## 2022-12-31 DIAGNOSIS — E039 Hypothyroidism, unspecified: Secondary | ICD-10-CM | POA: Diagnosis not present

## 2022-12-31 LAB — COMPREHENSIVE METABOLIC PANEL
ALT: 25 U/L (ref 0–53)
AST: 18 U/L (ref 0–37)
Albumin: 4.1 g/dL (ref 3.5–5.2)
Alkaline Phosphatase: 58 U/L (ref 39–117)
BUN: 20 mg/dL (ref 6–23)
CO2: 24 meq/L (ref 19–32)
Calcium: 9.1 mg/dL (ref 8.4–10.5)
Chloride: 106 meq/L (ref 96–112)
Creatinine, Ser: 1.17 mg/dL (ref 0.40–1.50)
GFR: 65.67 mL/min (ref >60.00)
Glucose, Bld: 108 mg/dL — ABNORMAL HIGH (ref 70–99)
Potassium: 4.4 meq/L (ref 3.5–5.1)
Sodium: 137 meq/L (ref 135–145)
Total Bilirubin: 0.4 mg/dL (ref 0.2–1.2)
Total Protein: 6.2 g/dL (ref 6.0–8.3)

## 2022-12-31 LAB — LIPID PANEL
Cholesterol: 104 mg/dL (ref 0–200)
HDL: 44.7 mg/dL (ref >39.00)
LDL Cholesterol: 39 mg/dL (ref 0–99)
NonHDL: 58.92
Total CHOL/HDL Ratio: 2
Triglycerides: 101 mg/dL (ref 0.0–149.0)
VLDL: 20.2 mg/dL (ref 0.0–40.0)

## 2022-12-31 LAB — TSH: TSH: 3.67 u[IU]/mL (ref 0.35–5.50)

## 2022-12-31 LAB — PSA: PSA: 0.59 ng/mL (ref 0.10–4.00)

## 2023-01-07 ENCOUNTER — Encounter: Payer: Self-pay | Admitting: Family Medicine

## 2023-01-07 ENCOUNTER — Ambulatory Visit: Payer: Medicare Other | Admitting: Family Medicine

## 2023-01-07 VITALS — BP 136/84 | HR 65 | Temp 97.6°F | Ht 74.5 in | Wt 221.2 lb

## 2023-01-07 DIAGNOSIS — Z0000 Encounter for general adult medical examination without abnormal findings: Secondary | ICD-10-CM | POA: Diagnosis not present

## 2023-01-07 DIAGNOSIS — I10 Essential (primary) hypertension: Secondary | ICD-10-CM

## 2023-01-07 DIAGNOSIS — I2510 Atherosclerotic heart disease of native coronary artery without angina pectoris: Secondary | ICD-10-CM | POA: Diagnosis not present

## 2023-01-07 DIAGNOSIS — E785 Hyperlipidemia, unspecified: Secondary | ICD-10-CM | POA: Diagnosis not present

## 2023-01-07 DIAGNOSIS — Z1211 Encounter for screening for malignant neoplasm of colon: Secondary | ICD-10-CM

## 2023-01-07 DIAGNOSIS — E039 Hypothyroidism, unspecified: Secondary | ICD-10-CM | POA: Diagnosis not present

## 2023-01-07 MED ORDER — LEVOTHYROXINE SODIUM 125 MCG PO TABS: 125.0000 ug | ORAL_TABLET | Freq: Every day | ORAL | 4 refills | Status: DC

## 2023-01-07 NOTE — Assessment & Plan Note (Addendum)
Preventative protocols reviewed and updated unless pt declined. Discussed healthy diet and lifestyle. Congratulated on weight loss today with BMI in normal range.  He attributes to healthier diet and increased activity Agrees to GI referral for colonoscopy-will refer to [facility]

## 2023-01-07 NOTE — Assessment & Plan Note (Signed)
Chronic, stable on amlodipine.

## 2023-01-07 NOTE — Assessment & Plan Note (Signed)
Regularly seeing cardiology, appreciate their care.

## 2023-01-07 NOTE — Progress Notes (Signed)
Ph: [phone] Fax: [phone]   Patient ID: Jackson Williamson, male    DOB: 01/22/1956, 67 y.o.   MRN: [id number]  This visit was conducted in person.  BP 136/84   Pulse 65   Temp 97.6 F (36.4 C) (Oral)   Ht 6' 2.5" (1.892 m)   Wt 221 lb 4 oz (100.4 kg)   SpO2 98%   BMI 28.03 kg/m    CC: Welcome to medicare  Subjective:   HPI: Jackson Williamson is a 67 y.o. male presenting on 01/07/2023 for Annual Exam   No results found.  Flowsheet Row Office Visit from 01/07/2023 in [facility] [place]  PHQ-2 Total Score 0          01/07/2023   10:28 AM  Fall Risk   Falls in the past year? 0    Inferior STEMI 05/2019 s/p cath and PCI with DESx2 to RCA on crestor, aspirin. Completed DAPT x12 months with Effient. S/p DES to pLAD 08/2021, now on DAPT aspirin/plavix. LDL goal <70. Regularly followed by cardiology.   Hypothyroidism - on daily brand Synthroid (generic caused worsening GERD). 2022 retried generic due to cost - tolerating well.   Preventative: Colon cancer screening - aunt with colon cancer at age 69. iFOB normal last 2022.  Prostate cancer screening - checked yearly due to fmhx (uncle). No nocturia or BPH symptoms.  Lung cancer screening - not eligible  Flu shot - declines COVID vaccine - discussed - declines  Tetanus - 2006. 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 Interim medical history since  our last visit reviewed. Allergies and medications reviewed and updated. Outpatient Medications Prior to Visit  Medication Sig Dispense Refill   amLODipine (NORVASC) 5 MG tablet Take 1 tablet (5 mg total) by mouth daily. 90 tablet 6   aspirin 81 MG tablet Take 81 mg by mouth daily.      Cholecalciferol (VITAMIN D3) LIQD 4,000 Units by Does not apply route daily. Take 4 drops daily     clopidogrel (PLAVIX) 75 MG tablet TAKE ONE TABLET BY MOUTH ONCE DAILY 90 tablet 2   Coenzyme Q10 (CO Q-10) 100 MG CAPS Take 1 capsule by mouth daily.  0   Cyanocobalamin (VITAMIN B12 PO) Take 2,500 mcg by mouth daily. Takes 1/2 dropper     ezetimibe (ZETIA) 10 MG tablet Take 1 tablet (10 mg total) by mouth daily. 90 tablet 3   rosuvastatin (CRESTOR) 40 MG tablet Take 1 tablet (40 mg total) by mouth daily at 6 PM. 90 tablet 3   levothyroxine (SYNTHROID) 125 MCG tablet Take 1 tablet (125 mcg total) by mouth daily. 90 tablet 3   nitroGLYCERIN (NITROSTAT) 0.4 MG SL tablet Place 1 tablet (0.4 mg total) under the tongue every 5 (five) minutes as needed for chest pain. 25 tablet 3   No facility-administered medications prior to visit.  Per HPI unless specifically indicated in ROS section below Review of Systems  Constitutional:  Negative for activity change, appetite change, chills, fatigue, fever and unexpected weight change.  HENT:  Negative for hearing loss.   Eyes:  Negative for visual disturbance.  Respiratory:  Negative for cough, chest tightness, shortness of breath and wheezing.   Cardiovascular:  Negative for chest pain, palpitations and leg swelling.  Gastrointestinal:  Negative for abdominal distention, abdominal pain, blood in stool, constipation, diarrhea, nausea and vomiting.  Genitourinary:  Negative for difficulty urinating and hematuria.  Musculoskeletal:  Negative for arthralgias, myalgias and neck pain.  Skin:  Negative for rash.  Neurological:  Positive for dizziness (occ orthostatic).  Negative for seizures, syncope and headaches.  Hematological:  Negative for adenopathy. Does not bruise/bleed easily.  Psychiatric/Behavioral:  Negative for dysphoric mood. The patient is not nervous/anxious.     Objective:  BP 136/84   Pulse 65   Temp 97.6 F (36.4 C) (Oral)   Ht 6' 2.5" (1.892 m)   Wt 221 lb 4 oz (100.4 kg)   SpO2 98%   BMI 28.03 kg/m   Wt Readings from Last 3 Encounters:  01/07/23 221 lb 4 oz (100.4 kg)  05/14/22 231 lb 8 oz (105 kg)  01/04/22 235 lb 3.2 oz (106.7 kg)      Physical Exam Vitals and nursing note reviewed.  Constitutional:      General: He is not in acute distress.    Appearance: Normal appearance. He is well-developed. He is not ill-appearing.  HENT:     Head: Normocephalic and atraumatic.     Right Ear: Hearing, tympanic membrane, ear canal and external ear normal.     Left Ear: Hearing, tympanic membrane, ear canal and external ear normal.     Mouth/Throat:     Mouth: Mucous membranes are moist.     Pharynx: Oropharynx is clear. No oropharyngeal exudate or posterior oropharyngeal erythema.  Eyes:     General: No scleral icterus.    Extraocular Movements: Extraocular movements intact.     Conjunctiva/sclera: Conjunctivae normal.     Pupils: Pupils are equal, round, and reactive to light.  Neck:     Thyroid: No thyroid mass or thyromegaly.  Cardiovascular:     Rate and Rhythm: Normal rate and regular rhythm.     Pulses: Normal pulses.          Radial pulses are 2+ on the right side and 2+ on the left side.     Heart sounds: Normal heart sounds. No murmur heard. Pulmonary:     Effort: Pulmonary effort is normal. No respiratory distress.     Breath sounds: Normal breath sounds. No wheezing, rhonchi or rales.  Abdominal:     General: Bowel sounds are normal. There is no distension.     Palpations: Abdomen is soft. There is no mass.     Tenderness: There is no abdominal tenderness. There is no guarding or rebound.     Hernia: No  hernia is present.  Musculoskeletal:        General: Normal range of motion.     Cervical back: Normal range of motion and neck supple.     Right lower leg: No edema.     Left lower leg: No edema.  Lymphadenopathy:     Cervical: No cervical adenopathy.  Skin:    General: Skin is warm and dry.     Findings: No rash.  Neurological:     General: No focal  deficit present.     Mental Status: He is alert and oriented to person, place, and time.  Psychiatric:        Mood and Affect: Mood normal.        Behavior: Behavior normal.        Thought Content: Thought content normal.        Judgment: Judgment normal.       Results for orders placed or performed in visit on 12/31/22  TSH  Result Value Ref Range   TSH 3.67 0.35 - 5.50 uIU/mL  PSA  Result Value Ref Range   PSA 0.59 0.10 - 4.00 ng/mL  Comprehensive metabolic panel  Result Value Ref Range   Sodium 137 135 - 145 mEq/L   Potassium 4.4 3.5 - 5.1 mEq/L   Chloride 106 96 - 112 mEq/L   CO2 24 19 - 32 mEq/L   Glucose, Bld 108 (H) 70 - 99 mg/dL   BUN 20 6 - 23 mg/dL   Creatinine, Ser 1.61 0.40 - 1.50 mg/dL   Total Bilirubin 0.4 0.2 - 1.2 mg/dL   Alkaline Phosphatase 58 39 - 117 U/L   AST 18 0 - 37 U/L   ALT 25 0 - 53 U/L   Total Protein 6.2 6.0 - 8.3 g/dL   Albumin 4.1 3.5 - 5.2 g/dL   GFR 09.60 >45.40 mL/min   Calcium 9.1 8.4 - 10.5 mg/dL  Lipid panel  Result Value Ref Range   Cholesterol 104 0 - 200 mg/dL   Triglycerides 981.1 0.0 - 149.0 mg/dL   HDL 91.47 >82.95 mg/dL   VLDL 62.1 0.0 - 30.8 mg/dL   LDL Cholesterol 39 0 - 99 mg/dL   Total CHOL/HDL Ratio 2    NonHDL 58.92    Lab Results  Component Value Date   VITAMINB12 848 01/03/2021   Assessment & Plan:   Problem List Items Addressed This Visit     Healthcare maintenance - Primary (Chronic)    Preventative protocols reviewed and updated unless pt declined. Discussed healthy diet and lifestyle. Congratulated on weight loss today with BMI in normal range.  He  attributes to healthier diet and increased activity Agrees to GI referral for colonoscopy-will refer to [facility]      Hyperlipidemia    Great control on current regimen of Crestor and ezetimibe-continue, followed by cardiology. The ASCVD Risk score (Arnett DK, et al., 2019) failed to calculate for the following reasons:   The patient has a prior MI or stroke diagnosis      Hypothyroidism    Chronic, stable on current regimen  continue levothyroxine.       Relevant Medications   levothyroxine (SYNTHROID) 125 MCG tablet   CAD (coronary artery disease)    Regularly seeing cardiology, appreciate their care.      Hypertension    Chronic, stable on amlodipine      Other Visit Diagnoses     Special screening for malignant neoplasms, colon       Relevant Orders   Ambulatory referral to Gastroenterology        Meds ordered this encounter  Medications   levothyroxine (SYNTHROID) 125 MCG tablet    Sig: Take 1 tablet (125 mcg total) by mouth daily.    Dispense:  90 tablet    Refill:  4    Orders Placed This Encounter  Procedures   Ambulatory referral to Gastroenterology    Referral Priority:   Routine    Referral Type:   Consultation  Referral Reason:   Specialty Services Required    Number of Visits Requested:   1    Patient Instructions  Drop b12 dosing to daily.  You are doing well today Return as needed or in 1 year for next physical We will refer you to [facility] for colonoscopy   Follow up plan: Return in about 1 year (around 01/07/2024) for annual exam, prior fasting for blood work, medicare wellness visit.  Eustaquio Boyden, MD

## 2023-01-07 NOTE — Assessment & Plan Note (Signed)
Great control on current regimen of Crestor and ezetimibe-continue, followed by cardiology. The ASCVD Risk score (Arnett DK, et al., 2019) failed to calculate for the following reasons:   The patient has a prior MI or stroke diagnosis

## 2023-01-07 NOTE — Assessment & Plan Note (Signed)
Chronic, stable on current regimen - continue levothyroxine.  

## 2023-01-07 NOTE — Patient Instructions (Addendum)
Drop b12 dosing to daily.  You are doing well today Return as needed or in 1 year for next physical We will refer you to [facility] for colonoscopy

## 2023-01-16 ENCOUNTER — Encounter: Payer: Self-pay | Admitting: *Deleted

## 2023-01-20 ENCOUNTER — Other Ambulatory Visit: Payer: Self-pay | Admitting: Family Medicine

## 2023-01-23 ENCOUNTER — Telehealth: Payer: Self-pay

## 2023-01-23 ENCOUNTER — Other Ambulatory Visit: Payer: Self-pay | Admitting: *Deleted

## 2023-01-23 ENCOUNTER — Telehealth: Payer: Self-pay | Admitting: *Deleted

## 2023-01-23 ENCOUNTER — Telehealth: Payer: Self-pay | Admitting: Cardiovascular Disease

## 2023-01-23 DIAGNOSIS — Z1211 Encounter for screening for malignant neoplasm of colon: Secondary | ICD-10-CM

## 2023-01-23 MED ORDER — NA SULFATE-K SULFATE-MG SULF 17.5-3.13-1.6 GM/177ML PO SOLN: 1.0000 | Freq: Once | ORAL | 0 refills | Status: AC

## 2023-01-23 NOTE — Telephone Encounter (Signed)
Gastroenterology Pre-Procedure Review  Request Date: 02/19/2023 Requesting Physician: Dr. Servando Snare  PATIENT REVIEW QUESTIONS: The patient responded to the following health history questions as indicated:    1. Are you having any GI issues? no 2. Do you have a personal history of Polyps? no 3. Do you have a family history of Colon Cancer or Polyps? yes (sister had polyps) 4. Diabetes Mellitus? no 5. Joint replacements in the past 12 months?no 6. Major health problems in the past 3 months?no 7. Any artificial heart valves, MVP, or defibrillator?yes (CAD)    MEDICATIONS & ALLERGIES:    Patient reports the following regarding taking any anticoagulation/antiplatelet therapy:   Plavix, Coumadin, Eliquis, Xarelto, Lovenox, Pradaxa, Brilinta, or Effient? yes (Plavix) Aspirin? yes (81 mg)  Patient confirms/reports the following medications:  Current Outpatient Medications  Medication Sig Dispense Refill   amLODipine (NORVASC) 5 MG tablet Take 1 tablet (5 mg total) by mouth daily. 90 tablet 6   aspirin 81 MG tablet Take 81 mg by mouth daily.      Cholecalciferol (VITAMIN D3) LIQD 4,000 Units by Does not apply route daily. Take 4 drops daily     clopidogrel (PLAVIX) 75 MG tablet TAKE ONE TABLET BY MOUTH ONCE DAILY 90 tablet 2   Coenzyme Q10 (CO Q-10) 100 MG CAPS Take 1 capsule by mouth daily.  0   Cyanocobalamin (VITAMIN B12 PO) Take 2,500 mcg by mouth daily. Takes 1/2 dropper     ezetimibe (ZETIA) 10 MG tablet Take 1 tablet (10 mg total) by mouth daily. 90 tablet 3   levothyroxine (SYNTHROID) 125 MCG tablet Take 1 tablet (125 mcg total) by mouth daily. 90 tablet 4   nitroGLYCERIN (NITROSTAT) 0.4 MG SL tablet Place 1 tablet (0.4 mg total) under the tongue every 5 (five) minutes as needed for chest pain. 25 tablet 3   rosuvastatin (CRESTOR) 40 MG tablet TAKE ONE TABLET BY MOUTH ONCE A DAY AT SIX IN THE EVENING 90 tablet 3   No current facility-administered medications for this visit.    Patient  confirms/reports the following allergies:  No Active Allergies  No orders of the defined types were placed in this encounter.   AUTHORIZATION INFORMATION Primary Insurance: 1D#: Group #:  Secondary Insurance: 1D#: Group #:  SCHEDULE INFORMATION: Date: 02/19/2023 Time: Location:  [facility]

## 2023-01-23 NOTE — Telephone Encounter (Signed)
   Pre-operative Risk Assessment    Patient Name: Jackson Williamson  DOB: [birth year] MRN: [id number]      Request for Surgical Clearance    Procedure:   Colonoscopy  Date of Surgery:  Clearance TBD                                 Surgeon:  Not Indicated Surgeon's Group or Practice Name:  [facility] Phone number:  [phone] Fax number:  [phone]   Type of Clearance Requested:   - Pharmacy:  Hold Clopidogrel (Plavix)  and Hold Asprin 81mg     Type of Anesthesia:  General    Additional requests/questions:    SignedMaceo Pro Schools   01/23/2023, 4:38 PM

## 2023-01-23 NOTE — Telephone Encounter (Signed)
Patient called in to schedule his colonoscopy.

## 2023-01-23 NOTE — Telephone Encounter (Signed)
Colonoscopy schedule on 02/19/2023 with Dr Servando Snare at [facility]

## 2023-01-27 NOTE — Telephone Encounter (Signed)
   Name: Jackson Williamson  DOB: 01-Aug-1955  MRN: [id number]  Primary Cardiologist: Lorine Bears, MD  Chart reviewed as part of pre-operative protocol coverage. Because of [name]'s past medical history and time since last visit, he will require a follow-up in-office visit in order to better assess preoperative cardiovascular risk.  Pre-op covering staff: - Please schedule appointment and call patient to inform them. If patient already had an upcoming appointment within acceptable timeframe, please add "pre-op clearance" to the appointment notes so provider is aware. - Please contact requesting surgeon's office via preferred method (i.e, phone, fax) to inform them of need for appointment prior to surgery.  Can hold Plavix for 5 to 7 days prior to procedure.  Would recommend continuing aspirin throughout.  Please resume Plavix when medically safe to do so.  Sharlene Dory, PA-C  01/27/2023, 4:03 PM

## 2023-01-28 NOTE — Telephone Encounter (Signed)
I d.w the pre op APP pt has already rescheduled his missed in office appt for 03/20/23 with Dr. Kirke Corin. Ok per Jari Favre, [facility] to defer clearance to MD at appt 03/20/23. I will update all parties involved.

## 2023-01-29 ENCOUNTER — Telehealth: Payer: Self-pay

## 2023-01-29 NOTE — Telephone Encounter (Signed)
Message left for patient to return my call.  

## 2023-01-29 NOTE — Telephone Encounter (Signed)
Spoken to patient and inform him that we have cancel his colonoscopy.   Will call back when closer to his appointment with cardiology.  Called endo unit to make the change.

## 2023-01-29 NOTE — Telephone Encounter (Signed)
The patient called in to speak with Johny Drilling.

## 2023-01-29 NOTE — Telephone Encounter (Signed)
Spoken to patient and inform him that we have cancel his colonoscopy.    Will call back when closer to his appointment with cardiology.   Called endo unit to make the change.

## 2023-02-19 ENCOUNTER — Ambulatory Visit: Admit: 2023-02-19 | Payer: 59 | Admitting: Gastroenterology

## 2023-02-19 SURGERY — COLONOSCOPY WITH PROPOFOL
Anesthesia: General

## 2023-03-20 ENCOUNTER — Encounter: Payer: Self-pay | Admitting: Cardiovascular Disease

## 2023-03-20 ENCOUNTER — Ambulatory Visit: Payer: 59 | Attending: Cardiovascular Disease | Admitting: Cardiovascular Disease

## 2023-03-20 VITALS — BP 120/80 | HR 56 | Ht 75.0 in | Wt 223.0 lb

## 2023-03-20 DIAGNOSIS — I2510 Atherosclerotic heart disease of native coronary artery without angina pectoris: Secondary | ICD-10-CM

## 2023-03-20 DIAGNOSIS — I10 Essential (primary) hypertension: Secondary | ICD-10-CM

## 2023-03-20 DIAGNOSIS — Z01810 Encounter for preprocedural cardiovascular examination: Secondary | ICD-10-CM

## 2023-03-20 DIAGNOSIS — E785 Hyperlipidemia, unspecified: Secondary | ICD-10-CM

## 2023-03-20 DIAGNOSIS — E039 Hypothyroidism, unspecified: Secondary | ICD-10-CM | POA: Diagnosis not present

## 2023-03-20 NOTE — Patient Instructions (Signed)
Medication Instructions:  STOP the Plavix  *If you need a refill on your cardiac medications before your next appointment, please call your pharmacy*   Lab Work: None ordered If you have labs (blood work) drawn today and your tests are completely normal, you will receive your results only by: MyChart Message (if you have MyChart) OR A paper copy in the mail If you have any lab test that is abnormal or we need to change your treatment, we will call you to review the results.   Testing/Procedures: None ordered   Follow-Up: At [facility], you and your health needs are our priority.  As part of our continuing mission to provide you with exceptional heart care, we have created designated Provider Care Teams.  These Care Teams include your primary Cardiologist (physician) and Advanced Practice Providers (APPs -  Physician Assistants and Nurse Practitioners) who all work together to provide you with the care you need, when you need it.  We recommend signing up for the patient portal called "MyChart".  Sign up information is provided on this After Visit Summary.  MyChart is used to connect with patients for Virtual Visits (Telemedicine).  Patients are able to view lab/test results, encounter notes, upcoming appointments, etc.  Non-urgent messages can be sent to your provider as well.   To learn more about what you can do with MyChart, go to [email or web address].    Your next appointment:   6 month(s)  Provider:   You may see Lorine Bears, MD or one of the following Advanced Practice Providers on your designated Care Team:   Nicolasa Ducking, NP Eula Listen, PA-C Cadence Fransico Michael, PA-C Charlsie Quest, NP Carlos Levering, NP

## 2023-03-20 NOTE — Progress Notes (Signed)
Cardiology Office Note   Date:  03/20/2023   ID:  Jackson Williamson, DOB 1955-07-28, MRN [id number]  PCP:  Eustaquio Boyden, MD  Cardiologist:   Lorine Bears, MD   Chief Complaint  Patient presents with   Follow-up    6 month f/u no complaints today. Meds reviewed verbally with pt.       History of Present Illness: Jackson Williamson is a 68 y.o. male who presents for a follow-up visit regarding coronary artery disease. He has known history of hyperlipidemia and hypothyroidism.  He presented in March 2021 with inferior ST elevation myocardial infarction.  Cardiac catheterization showed occluded distal right coronary artery with significant stenosis in the proximal vessel.  There was also chronic occlusion of OM1 and borderline significant disease in the mid left circumflex and mid LAD as well as distal LAD.  I performed successful angioplasty and drug-eluting stent placement to the distal and proximal right coronary artery.  OM1 was probed with 2 wires but could not be crossed indicating chronic occlusion.  Echocardiogram showed an EF of 50-55% with no wall motion abnormalities.  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    He had COVID-19 infection twice in 2023.  He had worsening angina in 2023 with abnormal stress test.  Cardiac catheterization was performed in June 2023 which showed widely patent RCA stent with no significant restenosis, stable disease in the proximal, mid and distal LAD, known chronically occluded OM1 with collaterals and progression of mid left circumflex stenosis to 80% with minimal luminal area of 2.7 mm by IVUS.  I performed stent placement to the mid left circumflex.  He has been doing well with no chest pain, shortness of breath or palpitations.  No side effects with medications.  He is due for a colonoscopy and is concerned about having one.  I discussed this with him today and there is no contraindication from a cardiac  standpoint.  DAPT is optional at this point and clopidogrel can be discontinued anyway.  Past Medical History:  Diagnosis Date   3-vessel CAD    LHC 05/03/2019: Sig 3v CAD, dRCA 100% /pRCA 70% RCA --> p/dRCA with DES, OM1 99% probed, mLCx 60%, mLAD 70%, dLAD 90%, mild elv LVEDP,    Diastolic dysfunction    3/1 echo EF 50-55%, G1DD   ED (erectile dysfunction)    GERD (gastroesophageal reflux disease)    related to generic levothyroxine   History of chicken pox    Hyperlipidemia    Hypothyroidism     Past Surgical History:  Procedure Laterality Date   CORONARY STENT INTERVENTION N/A 08/24/2021   Procedure: CORONARY STENT INTERVENTION;  Surgeon: Iran Ouch, MD;  Location: [facility];  Service: Cardiovascular;  Laterality: N/A;   CORONARY ULTRASOUND/IVUS N/A 08/24/2021   Procedure: Intravascular Ultrasound/IVUS;  Surgeon: Iran Ouch, MD;  Location: [facility];  Service: Cardiovascular;  Laterality: N/A;   CORONARY/GRAFT ACUTE MI REVASCULARIZATION N/A 05/03/2019   Procedure: Coronary/Graft Acute MI Revascularization;  Surgeon: Iran Ouch, MD;  Location: [facility];  Service: Cardiovascular;  Laterality: N/A;   DOBUTAMINE STRESS ECHO  2013   normal per patient   HERNIA REPAIR Right 05/2012   RIH and umbilical   KNEE SURGERY  [phone]   left knee   LEFT HEART CATH AND CORONARY ANGIOGRAPHY N/A 05/03/2019   Procedure: LEFT HEART CATH AND CORONARY ANGIOGRAPHY;  Surgeon: Lorine Bears  A, MD;  Location: [facility];  Service: Cardiovascular;  Laterality: N/A;   LEFT HEART CATH AND CORONARY ANGIOGRAPHY Left 08/24/2021   Procedure: LEFT HEART CATH AND CORONARY ANGIOGRAPHY;  Surgeon: Iran Ouch, MD;  Location: [facility];  Service: Cardiovascular;  Laterality: Left;     Current Outpatient Medications  Medication Sig Dispense Refill   amLODipine (NORVASC) 5 MG tablet Take 1 tablet (5 mg total) by mouth daily. 90 tablet  6   aspirin 81 MG tablet Take 81 mg by mouth daily.      Cholecalciferol (VITAMIN D3) LIQD 4,000 Units by Does not apply route daily. Take 4 drops daily     clopidogrel (PLAVIX) 75 MG tablet TAKE ONE TABLET BY MOUTH ONCE DAILY 90 tablet 2   Coenzyme Q10 (CO Q-10) 100 MG CAPS Take 1 capsule by mouth daily.  0   Cyanocobalamin (VITAMIN B12 PO) Take 2,500 mcg by mouth daily. Takes 1/2 dropper     ezetimibe (ZETIA) 10 MG tablet Take 1 tablet (10 mg total) by mouth daily. 90 tablet 3   levothyroxine (SYNTHROID) 125 MCG tablet Take 1 tablet (125 mcg total) by mouth daily. 90 tablet 4   nitroGLYCERIN (NITROSTAT) 0.4 MG SL tablet Place 1 tablet (0.4 mg total) under the tongue every 5 (five) minutes as needed for chest pain. 25 tablet 3   rosuvastatin (CRESTOR) 40 MG tablet TAKE ONE TABLET BY MOUTH ONCE A DAY AT SIX IN THE EVENING 90 tablet 3   No current facility-administered medications for this visit.    Allergies:   Patient has no active allergies.    Social History:  The patient  reports that he has never smoked. He quit smokeless tobacco use about 15 years ago.  His smokeless tobacco use included snuff. He reports that he does not drink alcohol and does not use drugs.   Family History:  The patient's family history includes CAD (age of onset: 58) in his brother; CAD (age of onset: 51) in his mother; CAD (age of onset: 35) in his father; Cancer (age of onset: 33) in his cousin; Cancer (age of onset: 77) in his maternal uncle; Cancer (age of onset: 52) in his maternal aunt; Cancer (age of onset: 33) in his maternal uncle; Heart attack in his maternal grandfather and mother; High Cholesterol in his sister; Hyperlipidemia in his brother, father, and mother; Hypertension in his father; Stroke in his maternal grandfather.    ROS:  Please see the history of present illness.   Otherwise, review of systems are positive for none.   All other systems are reviewed and negative.    PHYSICAL EXAM: VS:  BP  120/80 (BP Location: Left Arm, Patient Position: Sitting, Cuff Size: Large)   Pulse (!) 56   Ht 6\' 3"  (1.905 m)   Wt 223 lb (101.2 kg)   SpO2 99%   BMI 27.87 kg/m  , BMI Body mass index is 27.87 kg/m. GEN: Well nourished, well developed, in no acute distress  HEENT: normal  Neck: no JVD, carotid bruits, or masses Cardiac: RRR; no murmurs, rubs, or gallops,no edema  Respiratory:  clear to auscultation bilaterally, normal work of breathing GI: soft, nontender, nondistended, + BS MS: no deformity or atrophy  Skin: warm and dry, no rash Neuro:  Strength and sensation are intact Psych: euthymic mood, full affect   EKG:  EKG is ordered today. The ekg ordered today demonstrates : Sinus bradycardia Nonspecific T wave abnormality When compared  with ECG of 26-Aug-2021 21:58, No significant change was found    Recent Labs: 12/31/2022: ALT 25; BUN 20; Creatinine, Ser 1.17; Potassium 4.4; Sodium 137; TSH 3.67    Lipid Panel    Component Value Date/Time   CHOL 104 12/31/2022 0815   CHOL 131 07/22/2019 0851   CHOL 155 07/23/2012 0909   TRIG 101.0 12/31/2022 0815   TRIG 97 07/23/2012 0909   HDL 44.70 12/31/2022 0815   HDL 45 07/22/2019 0851   HDL 43 07/23/2012 0909   CHOLHDL 2 12/31/2022 0815   VLDL 20.2 12/31/2022 0815   LDLCALC 39 12/31/2022 0815   LDLCALC 71 07/22/2019 0851   LDLCALC 93 07/23/2012 0909      Wt Readings from Last 3 Encounters:  03/20/23 223 lb (101.2 kg)  01/07/23 221 lb 4 oz (100.4 kg)  05/14/22 231 lb 8 oz (105 kg)            No data to display            ASSESSMENT AND PLAN:  1.  Coronary artery disease involving native coronary arteries without angina: He is doing very well with no anginal symptoms.  It has been more than 1 year since most recent drug-eluting stent placement.  I discontinued clopidogrel today.  Continue aspirin 81 mg daily.  2.  Essential hypertension: Blood pressure is well-controlled on current medications.  3.   Hyperlipidemia: He is on rosuvastatin 40 mg daily.  During most recent visit, ezetimibe was added with excellent response.  Most recent lipid profile showed an LDL of 39.  4.  Hypothyroidism: Currently on levothyroxine.  5.  Preop cardiovascular evaluation for colonoscopy.  He is at low risk from a cardiac standpoint.  Clopidogrel was discontinued today.  Aspirin 81 mg once daily should not be interrupted.    Disposition:   FU with me in 6 months  Signed,  Lorine Bears, MD  03/20/2023 8:10 AM    [facility]

## 2023-03-26 ENCOUNTER — Encounter: Payer: Self-pay | Admitting: *Deleted

## 2023-04-01 ENCOUNTER — Telehealth: Payer: Self-pay | Admitting: Cardiovascular Disease

## 2023-04-01 ENCOUNTER — Other Ambulatory Visit: Payer: Self-pay | Admitting: *Deleted

## 2023-04-01 ENCOUNTER — Telehealth: Payer: Self-pay | Admitting: *Deleted

## 2023-04-01 DIAGNOSIS — Z1211 Encounter for screening for malignant neoplasm of colon: Secondary | ICD-10-CM

## 2023-04-01 MED ORDER — NA SULFATE-K SULFATE-MG SULF 17.5-3.13-1.6 GM/177ML PO SOLN: 1.0000 | Freq: Once | ORAL | 0 refills | Status: AC

## 2023-04-01 NOTE — Telephone Encounter (Signed)
   Pre-operative Risk Assessment    Patient Name: Jackson Williamson  DOB: 1955/07/28 MRN: [id number]   Date of last office visit: 03/20/23 Date of next office visit: n/a   Request for Surgical Clearance    Procedure:  Colonoscopy  Date of Surgery:  Clearance 05/29/23                                Surgeon:  not listed Surgeon's Group or Practice Name:  [facility] Phone number:  [phone] Fax number:  [phone]   Type of Clearance Requested:   - Medical    Type of Anesthesia:  General    Additional requests/questions:    Signed, Narda Amber   04/01/2023, 2:08 PM

## 2023-04-01 NOTE — Telephone Encounter (Signed)
This is a reschedule from 02/19/2023.  Patient was not cleared from cardiology at that time. He just had an appointment with Dr Kirke Corin on 03/20/2023.  Requesting to schedule on 05/29/2023 with Dr Servando Snare at [facility].  New instructions will be sent. Rx for Suprep have been [employer].

## 2023-04-02 NOTE — Telephone Encounter (Signed)
Addressed at office visit with Dr. Kirke Corin on 03/20/23. Dr. Jari Sportsman note has been faxed to requesting office. Request will be removed from preoperative pool.

## 2023-04-10 ENCOUNTER — Telehealth: Payer: Self-pay | Admitting: *Deleted

## 2023-04-10 NOTE — Telephone Encounter (Signed)
 Per cardiology at patient's follow up appointment on 03/20/2023:  He is due for a colonoscopy and is concerned about having one.  I discussed this with him today and there is no contraindication from a cardiac standpoint.  DAPT is optional at this point and clopidogrel  can be discontinued anyway.   ASSESSMENT AND PLAN:   5. Preop cardiovascular evaluation for colonoscopy. He is at low risk from a cardiac standpoint. Clopidogrel  was discontinued today. Aspirin  81 mg once daily should not be interrupted.   Disposition:   FU with me in 6 months   Signed,   Deatrice Cage, MD  03/20/2023 8:10 AM    [facility]

## 2023-05-29 ENCOUNTER — Encounter
Admission: RE | Disposition: A | Discharge status: Home / Self Care | Payer: Self-pay | Source: Home / Self Care | Attending: Gastroenterology

## 2023-05-29 ENCOUNTER — Other Ambulatory Visit: Payer: Self-pay

## 2023-05-29 ENCOUNTER — Ambulatory Visit: Admitting: Certified Registered"

## 2023-05-29 ENCOUNTER — Ambulatory Visit
Admission: RE | Admit: 2023-05-29 | Discharge: 2023-05-29 | Disposition: A | Discharge status: Home / Self Care | Payer: Medicare Other | Attending: Gastroenterology | Admitting: Gastroenterology

## 2023-05-29 ENCOUNTER — Encounter: Payer: Self-pay | Admitting: Gastroenterology

## 2023-05-29 DIAGNOSIS — I2510 Atherosclerotic heart disease of native coronary artery without angina pectoris: Secondary | ICD-10-CM | POA: Insufficient documentation

## 2023-05-29 DIAGNOSIS — K635 Polyp of colon: Secondary | ICD-10-CM

## 2023-05-29 DIAGNOSIS — K640 First degree hemorrhoids: Secondary | ICD-10-CM | POA: Insufficient documentation

## 2023-05-29 DIAGNOSIS — I252 Old myocardial infarction: Secondary | ICD-10-CM | POA: Insufficient documentation

## 2023-05-29 DIAGNOSIS — Z1211 Encounter for screening for malignant neoplasm of colon: Secondary | ICD-10-CM

## 2023-05-29 DIAGNOSIS — D122 Benign neoplasm of ascending colon: Secondary | ICD-10-CM | POA: Diagnosis not present

## 2023-05-29 DIAGNOSIS — E039 Hypothyroidism, unspecified: Secondary | ICD-10-CM | POA: Insufficient documentation

## 2023-05-29 DIAGNOSIS — I10 Essential (primary) hypertension: Secondary | ICD-10-CM | POA: Diagnosis not present

## 2023-05-29 DIAGNOSIS — Z955 Presence of coronary angioplasty implant and graft: Secondary | ICD-10-CM | POA: Insufficient documentation

## 2023-05-29 HISTORY — PX: POLYPECTOMY: SHX5525

## 2023-05-29 HISTORY — PX: COLONOSCOPY WITH PROPOFOL: SHX5780

## 2023-05-29 SURGERY — COLONOSCOPY WITH PROPOFOL
Anesthesia: General

## 2023-05-29 MED ORDER — PROPOFOL 10 MG/ML IV BOLUS
INTRAVENOUS | Status: DC | PRN
  Administered 2023-05-29: 100 mg via INTRAVENOUS
  Administered 2023-05-29: 120 ug/kg/min via INTRAVENOUS

## 2023-05-29 MED ORDER — LIDOCAINE HCL (PF) 2 % IJ SOLN
INTRAMUSCULAR | Status: AC
  Filled 2023-05-29: qty 5

## 2023-05-29 MED ORDER — LIDOCAINE HCL (CARDIAC) PF 100 MG/5ML IV SOSY
PREFILLED_SYRINGE | INTRAVENOUS | Status: DC | PRN
  Administered 2023-05-29: 100 mg via INTRAVENOUS

## 2023-05-29 MED ORDER — SODIUM CHLORIDE 0.9 % IV SOLN: INTRAVENOUS | Status: DC

## 2023-05-29 MED ORDER — PROPOFOL 10 MG/ML IV BOLUS
INTRAVENOUS | Status: AC
Start: 2023-05-29 — End: ?
  Filled 2023-05-29: qty 40

## 2023-05-29 NOTE — Anesthesia Preprocedure Evaluation (Signed)
 Anesthesia Evaluation  Patient identified by MRN, date of birth, ID band Patient awake    Reviewed: Allergy & Precautions, NPO status , Patient's Chart, lab work & pertinent test results  Airway Mallampati: II  TM Distance: >3 FB Neck ROM: full    Dental  (+) Teeth Intact   Pulmonary neg pulmonary ROS   Pulmonary exam normal breath sounds clear to auscultation       Cardiovascular Exercise Tolerance: Good + CAD, + Past MI and + Cardiac Stents  negative cardio ROS Normal cardiovascular exam Rhythm:Regular Rate:Normal     Neuro/Psych negative neurological ROS  negative psych ROS   GI/Hepatic negative GI ROS, Neg liver ROS,GERD  Medicated,,  Endo/Other  negative endocrine ROSHypothyroidism    Renal/GU negative Renal ROS  negative genitourinary   Musculoskeletal   Abdominal Normal abdominal exam  (+)   Peds negative pediatric ROS (+)  Hematology negative hematology ROS (+)   Anesthesia Other Findings Past Medical History: No date: 3-vessel CAD     Comment:  LHC 05/03/2019: Sig 3v CAD, dRCA 100% /pRCA 70% RCA -->               p/dRCA with DES, OM1 99% probed, mLCx 60%, mLAD 70%, dLAD              90%, mild elv LVEDP,  No date: Diastolic dysfunction     Comment:  3/1 echo EF 50-55%, G1DD No date: ED (erectile dysfunction) No date: GERD (gastroesophageal reflux disease)     Comment:  related to generic levothyroxine No date: History of chicken pox No date: Hyperlipidemia No date: Hypothyroidism  Past Surgical History: 08/24/2021: CORONARY STENT INTERVENTION; N/A     Comment:  Procedure: CORONARY STENT INTERVENTION;  Surgeon: Iran Ouch, MD;  Location: [facility];                Service: Cardiovascular;  Laterality: N/A; 08/24/2021: CORONARY ULTRASOUND/IVUS; N/A     Comment:  Procedure: Intravascular Ultrasound/IVUS;  Surgeon:               Iran Ouch, MD;  Location: [facility]  INVASIVE CV [facility];                Service: Cardiovascular;  Laterality: N/A; 05/03/2019: CORONARY/GRAFT ACUTE MI REVASCULARIZATION; N/A     Comment:  Procedure: Coronary/Graft Acute MI Revascularization;                Surgeon: Iran Ouch, MD;  Location: [facility] INVAS[facility]               CV LAB;  Service: Cardiovascular;  Laterality: N/A; 2013: DOBUTAMINE STRESS ECHO     Comment:  normal per patient 05/2012: HERNIA REPAIR; Right     Comment:  RIH and umbilical [phone]: KNEE SURGERY     Comment:  left knee 05/03/2019: LEFT HEART CATH AND CORONARY ANGIOGRAPHY; N/A     Comment:  Procedure: LEFT HEART CATH AND CORONARY ANGIOGRAPHY;                Surgeon: Iran Ouch, MD;  Location: [facility] INVAS[facility]               CV LAB;  Service: Cardiovascular;  Laterality: N/A; 08/24/2021: LEFT HEART CATH AND CORONARY ANGIOGRAPHY; Left     Comment:  Procedure: LEFT HEART CATH AND CORONARY ANGIOGRAPHY;  Surgeon: Iran Ouch, MD;  Location: [facility] INVAS[facility]               CV LAB;  Service: Cardiovascular;  Laterality: Left;  BMI    Body Mass Index: 27.25 kg/m      Reproductive/Obstetrics negative OB ROS                             Anesthesia Physical Anesthesia Plan  ASA: 3  Anesthesia Plan: General   Post-op Pain Management:    Induction: Intravenous  PONV Risk Score and Plan: Propofol infusion and TIVA  Airway Management Planned: Natural Airway and Nasal Cannula  Additional Equipment:   Intra-op Plan:   Post-operative Plan:   Informed Consent: I have reviewed the patients History and Physical, chart, labs and discussed the procedure including the risks, benefits and alternatives for the proposed anesthesia with the patient or authorized representative who has indicated his/her understanding and acceptance.     Dental Advisory Given  Plan Discussed with: CRNA and Surgeon  Anesthesia Plan Comments:        Anesthesia Quick  Evaluation

## 2023-05-29 NOTE — Transfer of Care (Signed)
 Immediate Anesthesia Transfer of Care Note  Patient: Jackson Williamson  Procedure(s) Performed: COLONOSCOPY WITH PROPOFOL POLYPECTOMY  Patient Location: Endoscopy Unit  Anesthesia Type:General  Level of Consciousness: drowsy  Airway & Oxygen Therapy: Patient Spontanous Breathing and Patient connected to nasal cannula oxygen  Post-op Assessment: Report given to RN and Post -op Vital signs reviewed and stable  Post vital signs: Reviewed and stable  Last Vitals:  Vitals Value Taken Time  BP 121/64 0822  Temp 35.8 0823  Pulse 59 0823  Resp 18 0823  SpO2 98 0823    Last Pain:  Vitals:   05/29/23 0733  TempSrc: Temporal  PainSc: 0-No pain         Complications: No notable events documented.

## 2023-05-29 NOTE — H&P (Signed)
 Midge Minium, MD [facility] [address] [place], [place] [address] Phone: [phone] Fax : [phone]  Primary Care Physician:  Eustaquio Boyden, MD Primary Gastroenterologist:  Dr. Servando Snare  Pre-Procedure History & Physical: HPI:  Jackson Williamson is a 68 y.o. male is here for a screening colonoscopy.   Past Medical History:  Diagnosis Date   3-vessel CAD    LHC 05/03/2019: Sig 3v CAD, dRCA 100% /pRCA 70% RCA --> p/dRCA with DES, OM1 99% probed, mLCx 60%, mLAD 70%, dLAD 90%, mild elv LVEDP,    Diastolic dysfunction    3/1 echo EF 50-55%, G1DD   ED (erectile dysfunction)    GERD (gastroesophageal reflux disease)    related to generic levothyroxine   History of chicken pox    Hyperlipidemia    Hypothyroidism     Past Surgical History:  Procedure Laterality Date   CORONARY STENT INTERVENTION N/A 08/24/2021   Procedure: CORONARY STENT INTERVENTION;  Surgeon: Iran Ouch, MD;  Location: [facility];  Service: Cardiovascular;  Laterality: N/A;   CORONARY ULTRASOUND/IVUS N/A 08/24/2021   Procedure: Intravascular Ultrasound/IVUS;  Surgeon: Iran Ouch, MD;  Location: [facility];  Service: Cardiovascular;  Laterality: N/A;   CORONARY/GRAFT ACUTE MI REVASCULARIZATION N/A 05/03/2019   Procedure: Coronary/Graft Acute MI Revascularization;  Surgeon: Iran Ouch, MD;  Location: [facility];  Service: Cardiovascular;  Laterality: N/A;   DOBUTAMINE STRESS ECHO  2013   normal per patient   HERNIA REPAIR Right 05/2012   RIH and umbilical   KNEE SURGERY  [phone]   left knee   LEFT HEART CATH AND CORONARY ANGIOGRAPHY N/A 05/03/2019   Procedure: LEFT HEART CATH AND CORONARY ANGIOGRAPHY;  Surgeon: Iran Ouch, MD;  Location: [facility];  Service: Cardiovascular;  Laterality: N/A;   LEFT HEART CATH AND CORONARY ANGIOGRAPHY Left 08/24/2021   Procedure: LEFT HEART CATH AND CORONARY ANGIOGRAPHY;  Surgeon: Iran Ouch, MD;   Location: [facility];  Service: Cardiovascular;  Laterality: Left;    Prior to Admission medications   Medication Sig Start Date End Date Taking? Authorizing Provider  amLODipine (NORVASC) 5 MG tablet Take 1 tablet (5 mg total) by mouth daily. 12/20/22  Yes Iran Ouch, MD  aspirin 81 MG tablet Take 81 mg by mouth daily.    Yes [provider]  Cholecalciferol (VITAMIN D3) LIQD 4,000 Units by Does not apply route daily. Take 4 drops daily 01/04/21  Yes Eustaquio Boyden, MD  Coenzyme Q10 (CO Q-10) 100 MG CAPS Take 1 capsule by mouth daily. 06/03/19  Yes Eustaquio Boyden, MD  Cyanocobalamin (VITAMIN B12 PO) Take 2,500 mcg by mouth daily. Takes 1/2 dropper   Yes [provider]  ezetimibe (ZETIA) 10 MG tablet Take 1 tablet (10 mg total) by mouth daily. 05/14/22 05/29/23 Yes Iran Ouch, MD  rosuvastatin (CRESTOR) 40 MG tablet TAKE ONE TABLET BY MOUTH ONCE A DAY AT SIX IN THE EVENING 01/20/23  Yes Eustaquio Boyden, MD  levothyroxine (SYNTHROID) 125 MCG tablet Take 1 tablet (125 mcg total) by mouth daily. 01/07/23   Eustaquio Boyden, MD  nitroGLYCERIN (NITROSTAT) 0.4 MG SL tablet Place 1 tablet (0.4 mg total) under the tongue every 5 (five) minutes as needed for chest pain. 08/23/21 03/20/23  Furth, Cadence H, PA-C    Allergies as of 04/01/2023   (No Active Allergies)    Family History  Problem Relation Age of Onset   CAD Mother 26  s/p stent   Hyperlipidemia Mother    Heart attack Mother    CAD Father 30       s/p CABG x 4   Hyperlipidemia Father    Hypertension Father    Hyperlipidemia Brother    CAD Brother 28       stent, 6v CABG   Cancer Maternal Aunt 48       colon   Cancer Maternal Uncle 30       prostate   Cancer Cousin 40       prostate   Cancer Maternal Uncle 3       kidney   High Cholesterol Sister    Heart attack Maternal Grandfather    Stroke Maternal Grandfather     Social History   Socioeconomic History   Marital  status: Single    Spouse name: Not on file   Number of children: Not on file   Years of education: Not on file   Highest education level: Not on file  Occupational History   Not on file  Tobacco Use   Smoking status: Never   Smokeless tobacco: Former    Types: Snuff    Quit date: 03/18/2008   Tobacco comments:    social as youth  Substance and Sexual Activity   Alcohol use: No   Drug use: No   Sexual activity: Not Currently  Other Topics Concern   Not on file  Social History Narrative   Divorced, 1 dog (Advertising account planner)   Grown daughter   Occupation: Museum/gallery curator   Edu: BS   Activity: runs, bikes, swims, cross fit 5-6 d/wk   Diet: good water, fruits/vegetables daily, "80% paleo"   Social Drivers of Corporate investment banker Strain: Not on file  Food Insecurity: Not on file  Transportation Needs: Not on file  Physical Activity: Not on file  Stress: Not on file  Social Connections: Not on file  Intimate Partner Violence: Not on file    Review of Systems: See HPI, otherwise negative ROS  Physical Exam: BP 126/84   Pulse (!) 48   Temp (!) 96 F (35.6 C) (Temporal)   Resp 15   Ht 6\' 3"  (1.905 m)   Wt 98.9 kg   SpO2 100%   BMI 27.25 kg/m  General:   Alert,  pleasant and cooperative in NAD Head:  Normocephalic and atraumatic. Neck:  Supple; no masses or thyromegaly. Lungs:  Clear throughout to auscultation.    Heart:  Regular rate and rhythm. Abdomen:  Soft, nontender and nondistended. Normal bowel sounds, without guarding, and without rebound.   Neurologic:  Alert and  oriented x4;  grossly normal neurologically.  Impression/Plan: Jackson Williamson is now here to undergo a screening colonoscopy.  Risks, benefits, and alternatives regarding colonoscopy have been reviewed with the patient.  Questions have been answered.  All parties agreeable.

## 2023-05-29 NOTE — Op Note (Signed)
 [facility] Gastroenterology Patient Name: Jackson Williamson Procedure Date: 05/29/2023 8:01 AM MRN: [id number] Account #: [id number] Date of Birth: 1955/04/04 Admit Type: Outpatient Age: 68 Room: [facility] ENDO ROOM 4 Gender: Male Note Status: Finalized Instrument Name: Prentice Docker [id number] Procedure:             Colonoscopy Indications:           Screening for colorectal malignant neoplasm Providers:             Midge Minium MD, MD Referring MD:          Eustaquio Boyden (Referring MD) Medicines:             Propofol per Anesthesia Complications:         No immediate complications. Procedure:             Pre-Anesthesia Assessment:                        - Prior to the procedure, a History and Physical was                         performed, and patient medications and allergies were                         reviewed. The patient's tolerance of previous                         anesthesia was also reviewed. The risks and benefits                         of the procedure and the sedation options and risks                         were discussed with the patient. All questions were                         answered, and informed consent was obtained. Prior                         Anticoagulants: The patient has taken no anticoagulant                         or antiplatelet agents. ASA Grade Assessment: II - A                         patient with mild systemic disease. After reviewing                         the risks and benefits, the patient was deemed in                         satisfactory condition to undergo the procedure.                        After obtaining informed consent, the colonoscope was                         passed under direct vision. Throughout the procedure,  the patient's blood pressure, pulse, and oxygen                         saturations were monitored continuously. The                         Colonoscope was introduced through the  anus and                         advanced to the the cecum, identified by appendiceal                         orifice and ileocecal valve. Findings:      The perianal and digital rectal examinations were normal.      Two sessile polyps were found in the ascending colon. The polyps were 2       to 3 mm in size. These polyps were removed with a cold snare. Resection       and retrieval were complete.      A 3 mm polyp was found in the sigmoid colon. The polyp was sessile. The       polyp was removed with a cold snare. Resection and retrieval were       complete.      Non-bleeding internal hemorrhoids were found during retroflexion. The       hemorrhoids were Grade I (internal hemorrhoids that do not prolapse). Impression:            - Two 2 to 3 mm polyps in the ascending colon, removed                         with a cold snare. Resected and retrieved.                        - One 3 mm polyp in the sigmoid colon, removed with a                         cold snare. Resected and retrieved.                        - Non-bleeding internal hemorrhoids. Recommendation:        - Discharge patient to home.                        - Resume previous diet.                        - Continue present medications.                        - Await pathology results.                        - If the pathology report reveals adenomatous tissue,                         then repeat the colonoscopy for surveillance in 5                         years. Procedure Code(s):     --- Professional ---  16109, Colonoscopy, flexible; with removal of                         tumor(s), polyp(s), or other lesion(s) by snare                         technique Diagnosis Code(s):     --- Professional ---                        Z12.11, Encounter for screening for malignant neoplasm                         of colon                        D12.5, Benign neoplasm of sigmoid colon CPT copyright 2022 American  Medical Association. All rights reserved. The codes documented in this report are preliminary and upon coder review may  be revised to meet current compliance requirements. Midge Minium MD, MD 05/29/2023 8:21:15 AM This report has been signed electronically. Number of Addenda: 0 Note Initiated On: 05/29/2023 8:01 AM Scope Withdrawal Time: 0 hours 9 minutes 6 seconds  Total Procedure Duration: 0 hours 13 minutes 10 seconds  Estimated Blood Loss:  Estimated blood loss: none.      [facility]

## 2023-05-29 NOTE — Anesthesia Postprocedure Evaluation (Signed)
 Anesthesia Post Note  Patient: Jackson Williamson  Procedure(s) Performed: COLONOSCOPY WITH PROPOFOL POLYPECTOMY  Patient location during evaluation: PACU Anesthesia Type: General Level of consciousness: awake Pain management: pain level controlled Vital Signs Assessment: post-procedure vital signs reviewed and stable Respiratory status: spontaneous breathing Cardiovascular status: blood pressure returned to baseline Anesthetic complications: no   No notable events documented.   Last Vitals:  Vitals:   05/29/23 0834 05/29/23 0844  BP: 130/87 126/84  Pulse: (!) 59 (!) 48  Resp: 20 15  Temp:    SpO2: 97% 100%    Last Pain:  Vitals:   05/29/23 0844  TempSrc:   PainSc: 0-No pain                 VAN STAVEREN,Akemi Overholser

## 2023-05-30 ENCOUNTER — Encounter: Payer: Self-pay | Admitting: Gastroenterology

## 2023-05-30 LAB — SURGICAL PATHOLOGY

## 2023-05-31 ENCOUNTER — Encounter: Payer: Self-pay | Admitting: Family Medicine

## 2023-06-02 ENCOUNTER — Encounter: Payer: Self-pay | Admitting: Gastroenterology

## 2023-06-16 ENCOUNTER — Other Ambulatory Visit: Payer: Self-pay | Admitting: Medical

## 2023-07-22 ENCOUNTER — Other Ambulatory Visit: Payer: Self-pay | Admitting: Cardiovascular Disease

## 2024-01-06 ENCOUNTER — Other Ambulatory Visit: Payer: Self-pay | Admitting: Cardiovascular Disease

## 2024-01-08 ENCOUNTER — Encounter: Payer: Self-pay | Admitting: Cardiovascular Disease

## 2024-01-08 ENCOUNTER — Ambulatory Visit: Attending: Cardiovascular Disease | Admitting: Cardiovascular Disease

## 2024-01-08 VITALS — BP 118/72 | HR 51 | Ht 75.0 in | Wt 214.0 lb

## 2024-01-08 DIAGNOSIS — I2510 Atherosclerotic heart disease of native coronary artery without angina pectoris: Secondary | ICD-10-CM

## 2024-01-08 DIAGNOSIS — E785 Hyperlipidemia, unspecified: Secondary | ICD-10-CM

## 2024-01-08 DIAGNOSIS — I10 Essential (primary) hypertension: Secondary | ICD-10-CM | POA: Diagnosis not present

## 2024-01-08 MED ORDER — AMLODIPINE BESYLATE 2.5 MG PO TABS: 2.5000 mg | ORAL_TABLET | Freq: Every day | ORAL | 3 refills | Status: AC

## 2024-01-08 NOTE — Progress Notes (Signed)
 Cardiology Office Note   Date:  01/08/2024   ID:  Jackson Williamson, DOB 16-Feb-1956, MRN [id number]  PCP:  Rilla Baller, MD  Cardiologist:   Deatrice Cage, MD   Chief Complaint  Patient presents with   Follow-up    6 month follow up /  pt has been doing well with no complaints of chest pain, chest pressure or SOB, medication reviewed verbally with patient.       History of Present Illness: Jackson Williamson is a 68 y.o. male who presents for a follow-up visit regarding coronary artery disease. He has known history of hyperlipidemia and hypothyroidism.  He presented in March 2021 with inferior ST elevation myocardial infarction.  Cardiac catheterization showed occluded distal right coronary artery with significant stenosis in the proximal vessel.  There was also chronic occlusion of OM1 and borderline significant disease in the mid left circumflex and mid LAD as well as distal LAD.  I performed successful angioplasty and drug-eluting stent placement to the distal and proximal right coronary artery.  OM1 was probed with 2 wires but could not be crossed indicating chronic occlusion.  Echocardiogram showed an EF of 50-55% with no wall motion abnormalities.  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    He had COVID-19 infection twice in 2023.  He had worsening angina in 2023 with abnormal stress test.  Cardiac catheterization was performed in June 2023 which showed widely patent RCA stent with no significant restenosis, stable disease in the proximal, mid and distal LAD, known chronically occluded OM1 with collaterals and progression of mid left circumflex stenosis to 80% with minimal luminal area of 2.7 mm by IVUS.  I performed stent placement to the mid left circumflex.  He has been doing very well with no chest pain, shortness of breath or palpitations.  He is more active now and tries to exercise on a regular basis.  He ran out of amlodipine  2 days  ago and his blood pressure is still in the normal range.  Past Medical History:  Diagnosis Date   3-vessel CAD    LHC 05/03/2019: Sig 3v CAD, dRCA 100% /pRCA 70% RCA --> p/dRCA with DES, OM1 99% probed, mLCx 60%, mLAD 70%, dLAD 90%, mild elv LVEDP,    Diastolic dysfunction    3/1 echo EF 50-55%, G1DD   ED (erectile dysfunction)    GERD (gastroesophageal reflux disease)    related to generic levothyroxine    History of chicken pox    Hyperlipidemia    Hypothyroidism     Past Surgical History:  Procedure Laterality Date   COLONOSCOPY WITH PROPOFOL  N/A 05/29/2023   TAx2, HP, int hem, rpt ? Renne, Darren, MD)   CORONARY STENT INTERVENTION N/A 08/24/2021   Procedure: CORONARY STENT INTERVENTION;  Surgeon: Cage Deatrice LABOR, MD;  Location: [facility] INVASIVE CV LAB;  Service: Cardiovascular;  Laterality: N/A;   CORONARY ULTRASOUND/IVUS N/A 08/24/2021   Procedure: Intravascular Ultrasound/IVUS;  Surgeon: Cage Deatrice LABOR, MD;  Location: [facility] INVASIVE CV LAB;  Service: Cardiovascular;  Laterality: N/A;   CORONARY/GRAFT ACUTE MI REVASCULARIZATION N/A 05/03/2019   Procedure: Coronary/Graft Acute MI Revascularization;  Surgeon: Cage Deatrice LABOR, MD;  Location: [facility] INVASIVE CV LAB;  Service: Cardiovascular;  Laterality: N/A;   DOBUTAMINE  STRESS ECHO  03/05/2011   normal per patient   HERNIA REPAIR Right 05/02/2012   RIH and umbilical   KNEE SURGERY  [id number]   left knee   LEFT HEART  CATH AND CORONARY ANGIOGRAPHY N/A 05/03/2019   Procedure: LEFT HEART CATH AND CORONARY ANGIOGRAPHY;  Surgeon: Darron Deatrice LABOR, MD;  Location: [facility] INVASIVE CV LAB;  Service: Cardiovascular;  Laterality: N/A;   LEFT HEART CATH AND CORONARY ANGIOGRAPHY Left 08/24/2021   Procedure: LEFT HEART CATH AND CORONARY ANGIOGRAPHY;  Surgeon: Darron Deatrice LABOR, MD;  Location: [facility] INVASIVE CV LAB;  Service: Cardiovascular;  Laterality: Left;   POLYPECTOMY  05/29/2023   Procedure: POLYPECTOMY;  Surgeon: Jinny Carmine, MD;   Location: [facility] ENDOSCOPY;  Service: Endoscopy;;     Current Outpatient Medications  Medication Sig Dispense Refill   amLODipine  (NORVASC ) 5 MG tablet Take 1 tablet (5 mg total) by mouth daily. 90 tablet 6   aspirin  81 MG tablet Take 81 mg by mouth daily.      Cholecalciferol (VITAMIN D3) LIQD 4,000 Units by Does not apply route daily. Take 4 drops daily     Coenzyme Q10 (CO Q-10) 100 MG CAPS Take 1 capsule by mouth daily.  0   Cyanocobalamin (VITAMIN B12 PO) Take 2,500 mcg by mouth daily. Takes 1/2 dropper     ezetimibe  (ZETIA ) 10 MG tablet TAKE ONE TABLET (10 MG TOTAL) BY MOUTH DAILY. 90 tablet 3   levothyroxine  (SYNTHROID ) 125 MCG tablet Take 1 tablet (125 mcg total) by mouth daily. 90 tablet 4   nitroGLYCERIN  (NITROSTAT ) 0.4 MG SL tablet Place 1 tablet (0.4 mg total) under the tongue every 5 (five) minutes as needed for chest pain. 25 tablet 3   rosuvastatin  (CRESTOR ) 40 MG tablet TAKE ONE TABLET BY MOUTH ONCE A DAY AT SIX IN THE EVENING 90 tablet 3   No current facility-administered medications for this visit.    Allergies:   Patient has no known allergies.    Social History:  The patient  reports that he has never smoked. He quit smokeless tobacco use about 15 years ago.  His smokeless tobacco use included snuff. He reports that he does not drink alcohol and does not use drugs.   Family History:  The patient's family history includes CAD (age of onset: 51) in his brother; CAD (age of onset: 56) in his mother; CAD (age of onset: 28) in his father; Cancer (age of onset: 88) in his cousin; Cancer (age of onset: 57) in his maternal uncle; Cancer (age of onset: 105) in his maternal aunt; Cancer (age of onset: 15) in his maternal uncle; Heart attack in his maternal grandfather and mother; High Cholesterol in his sister; Hyperlipidemia in his brother, father, and mother; Hypertension in his father; Stroke in his maternal grandfather.    ROS:  Please see the history of present illness.    Otherwise, review of systems are positive for none.   All other systems are reviewed and negative.    PHYSICAL EXAM: VS:  BP 118/72 (BP Location: Left Arm, Patient Position: Sitting, Cuff Size: Normal)   Pulse (!) 51   Ht 6' 3 (1.905 m)   Wt 214 lb (97.1 kg)   SpO2 99%   BMI 26.75 kg/m  , BMI Body mass index is 26.75 kg/m. GEN: Well nourished, well developed, in no acute distress  HEENT: normal  Neck: no JVD, carotid bruits, or masses Cardiac: RRR; no murmurs, rubs, or gallops,no edema  Respiratory:  clear to auscultation bilaterally, normal work of breathing GI: soft, nontender, nondistended, + BS MS: no deformity or atrophy  Skin: warm and dry, no rash Neuro:  Strength and sensation are intact Psych: euthymic mood, full  affect   EKG:  EKG is ordered today. The ekg ordered today demonstrates : Sinus bradycardia Possible Inferior infarct , age undetermined When compared with ECG of 20-Mar-2023 08:00, No significant change was found    Recent Labs: No results found for requested labs within last 365 days.    Lipid Panel    Component Value Date/Time   CHOL 104 12/31/2022 0815   CHOL 131 07/22/2019 0851   CHOL 155 07/23/2012 0909   TRIG 101.0 12/31/2022 0815   TRIG 97 07/23/2012 0909   HDL 44.70 12/31/2022 0815   HDL 45 07/22/2019 0851   HDL 43 07/23/2012 0909   CHOLHDL 2 12/31/2022 0815   VLDL 20.2 12/31/2022 0815   LDLCALC 39 12/31/2022 0815   LDLCALC 71 07/22/2019 0851   LDLCALC 93 07/23/2012 0909      Wt Readings from Last 3 Encounters:  01/08/24 214 lb (97.1 kg)  05/29/23 218 lb (98.9 kg)  03/20/23 223 lb (101.2 kg)            No data to display            ASSESSMENT AND PLAN:  1.  Coronary artery disease involving native coronary arteries without angina: He is doing very well with no anginal symptoms.  Continue low-dose aspirin .  2.  Essential hypertension: Blood pressure is in the normal range even after he ran out of amlodipine  few  days ago.  Will continue this but decrease the dose to 2.5 mg once daily.  His blood pressure was significantly high in 2023 and I am concerned that blood pressure might increase significantly if we stop the medication altogether.  3.  Hyperlipidemia: Continue high-dose rosuvastatin  and ezetimibe .  Most recent lipid profile showed an LDL of 39.  He has a physical tomorrow and will get updated labs.  4.  Hypothyroidism: Currently on levothyroxine .    Disposition:   FU with me in 12 months  Signed,  Deatrice Cage, MD  01/08/2024 8:47 AM    [facility]

## 2024-01-08 NOTE — Patient Instructions (Addendum)
 Medication Instructions:  Your physician has recommended you make the following change in your medication:   Decrease: Start Amlodipine  2.5 mg tablet 1 tablet by mouth once daily.  *If you need a refill on your cardiac medications before your next appointment, please call your pharmacy*  Lab Work:  NONE  If you have labs (blood work) drawn today and your tests are completely normal, you will receive your results only by: MyChart Message (if you have MyChart) OR A paper copy in the mail If you have any lab test that is abnormal or we need to change your treatment, we will call you to review the results.  Testing/Procedures:  NONE  Follow-Up: At [facility], you and your health needs are our priority.  As part of our continuing mission to provide you with exceptional heart care, our providers are all part of one team.  This team includes your primary Cardiologist (physician) and Advanced Practice Providers or APPs (Physician Assistants and Nurse Practitioners) who all work together to provide you with the care you need, when you need it.  Your next appointment:   12 month(s)  Provider:   You may see Deatrice Cage, MD or one of the following Advanced Practice Providers on your designated Care Team:    Cadence Franchester, [place]   We recommend signing up for the patient portal called MyChart.  Sign up information is provided on this After Visit Summary.  MyChart is used to connect with patients for Virtual Visits (Telemedicine).  Patients are able to view lab/test results, encounter notes, upcoming appointments, etc.  Non-urgent messages can be sent to your provider as well.   To learn more about what you can do with MyChart, go to [email or web address].

## 2024-01-09 ENCOUNTER — Encounter: Payer: Self-pay | Admitting: Family Medicine

## 2024-01-09 ENCOUNTER — Ambulatory Visit: Payer: 59 | Admitting: Family Medicine

## 2024-01-09 VITALS — BP 122/80 | HR 76 | Temp 98.6°F | Ht 74.5 in | Wt 216.0 lb

## 2024-01-09 DIAGNOSIS — Z125 Encounter for screening for malignant neoplasm of prostate: Secondary | ICD-10-CM | POA: Diagnosis not present

## 2024-01-09 DIAGNOSIS — I10 Essential (primary) hypertension: Secondary | ICD-10-CM | POA: Diagnosis not present

## 2024-01-09 DIAGNOSIS — Z7189 Other specified counseling: Secondary | ICD-10-CM | POA: Insufficient documentation

## 2024-01-09 DIAGNOSIS — E039 Hypothyroidism, unspecified: Secondary | ICD-10-CM | POA: Diagnosis not present

## 2024-01-09 DIAGNOSIS — I2510 Atherosclerotic heart disease of native coronary artery without angina pectoris: Secondary | ICD-10-CM

## 2024-01-09 DIAGNOSIS — E785 Hyperlipidemia, unspecified: Secondary | ICD-10-CM | POA: Diagnosis not present

## 2024-01-09 DIAGNOSIS — Z0000 Encounter for general adult medical examination without abnormal findings: Secondary | ICD-10-CM | POA: Diagnosis not present

## 2024-01-09 LAB — LIPID PANEL
Cholesterol: 116 mg/dL (ref 0–200)
HDL: 54.4 mg/dL
LDL Cholesterol: 51 mg/dL (ref 0–99)
NonHDL: 61.42
Total CHOL/HDL Ratio: 2
Triglycerides: 51 mg/dL (ref 0.0–149.0)
VLDL: 10.2 mg/dL (ref 0.0–40.0)

## 2024-01-09 LAB — COMPREHENSIVE METABOLIC PANEL WITH GFR
ALT: 47 U/L (ref 0–53)
AST: 21 U/L (ref 0–37)
Albumin: 4.6 g/dL (ref 3.5–5.2)
Alkaline Phosphatase: 73 U/L (ref 39–117)
BUN: 32 mg/dL — ABNORMAL HIGH (ref 6–23)
CO2: 24 meq/L (ref 19–32)
Calcium: 9.4 mg/dL (ref 8.4–10.5)
Chloride: 106 meq/L (ref 96–112)
Creatinine, Ser: 1.11 mg/dL (ref 0.40–1.50)
GFR: 69.45 mL/min
Glucose, Bld: 99 mg/dL (ref 70–99)
Potassium: 4.7 meq/L (ref 3.5–5.1)
Sodium: 139 meq/L (ref 135–145)
Total Bilirubin: 0.5 mg/dL (ref 0.2–1.2)
Total Protein: 6.8 g/dL (ref 6.0–8.3)

## 2024-01-09 LAB — MICROALBUMIN / CREATININE URINE RATIO
Creatinine,U: 62 mg/dL
Microalb Creat Ratio: UNDETERMINED mg/g (ref 0.0–30.0)
Microalb, Ur: <0.7 mg/dL

## 2024-01-09 LAB — VITAMIN B12: Vitamin B-12: 747 pg/mL (ref 211–911)

## 2024-01-09 LAB — VITAMIN D 25 HYDROXY (VIT D DEFICIENCY, FRACTURES): VITD: 54.15 ng/mL (ref 30.00–100.00)

## 2024-01-09 LAB — TSH: TSH: 4.81 u[IU]/mL (ref 0.35–5.50)

## 2024-01-09 LAB — PSA: PSA: 0.44 ng/mL (ref 0.10–4.00)

## 2024-01-09 MED ORDER — ROSUVASTATIN CALCIUM 40 MG PO TABS: 40.0000 mg | ORAL_TABLET | Freq: Every evening | ORAL | 3 refills | Status: AC

## 2024-01-09 MED ORDER — LEVOTHYROXINE SODIUM 125 MCG PO TABS: 125.0000 ug | ORAL_TABLET | Freq: Every day | ORAL | 3 refills | Status: AC

## 2024-01-09 NOTE — Assessment & Plan Note (Signed)
 Advanced directive discussion - does not have living will. Oldest daughter Jackson Williamson is HCPOA. Full code but does not want prolonged life support if terminal condition.

## 2024-01-09 NOTE — Assessment & Plan Note (Addendum)
 Chronic, update FLP on statin and zetia .  He is following carnivore diet.  The ASCVD Risk score (Arnett DK, et al., 2019) failed to calculate for the following reasons:   Risk score cannot be calculated because patient has a medical history suggesting prior/existing ASCVD

## 2024-01-09 NOTE — Progress Notes (Signed)
 Ph: [phone] Fax: [phone]   Patient ID: Jackson Williamson, male    DOB: 1955-04-27, 68 y.o.   MRN: [id number]  This visit was conducted in person.  BP 122/80   Pulse 76   Temp 98.6 F (37 C) (Oral)   Ht 6' 2.5 (1.892 m)   Wt 216 lb (98 kg)   SpO2 98%   BMI 27.36 kg/m    CC: CPE/AMW Subjective:   HPI: Jackson Williamson is a 68 y.o. male presenting on 01/09/2024 for Medicare Wellness   Did not see health advisor this year.   No results found.  Flowsheet Row Office Visit from 01/09/2024 in [facility]  PHQ-2 Total Score 0       01/09/2024   10:07 AM 01/07/2023   10:28 AM  Fall Risk   Falls in the past year? 0 0  Number falls in past yr: 0   Injury with Fall? 0   Risk for fall due to : No Fall Risks   Follow up Falls evaluation completed    Inferior STEMI 05/2019 s/p cath and PCI with DESx2 to RCA on crestor , aspirin . Completed DAPT x12 months with Effient . S/p DES to pLAD 08/2021, now on DAPT aspirin /plavix . LDL goal <70. Regularly followed by cardiology (Dr Darron).   Hypothyroidism - on 125mcg daily brand Synthroid  (generic caused worsening GERD). 2022 retried generic due to cost - tolerating well.    Preventative: Colonoscopy 05/2023 - TAx2, HP, int hem, rpt 5 (Wohl, Darren, MD)  Prostate cancer screening - checked yearly due to fmhx (uncle). No nocturia or BPH symptoms.  Lung cancer screening - not eligible  Flu shot - declines COVID vaccine - discussed - declines  Tetanus - 2006. Declines for now  Pneumococcal - declines Shingrix - declines  Advanced directive discussion - does not have living will. Oldest daughter Harlene is HCPOA. Full code but does not want prolonged life support if terminal condition.  Seat belt use discussed  Sunscreen use discussed, no changing moles  Sleep - averaging 7 hours/night  Non smoker  Alcohol - rare Dentist q6 mo  Eye exam - not regularly  Bowel - no constipation  Bladder - no  incontinence   Lives with daughter, 1 dog (advertising account planner)  Occupation: museum/gallery curator  Edu: BS  Activity: regularly active at gym 4-5x/wk [employer], [employer])  Diet: good water, fruits/vegetables daily, carnivore diet      Relevant past medical, surgical, family and social history reviewed and updated as indicated. Interim medical history since our last visit reviewed. Allergies and medications reviewed and updated. Outpatient Medications Prior to Visit  Medication Sig Dispense Refill   amLODipine  (NORVASC ) 2.5 MG tablet Take 1 tablet (2.5 mg total) by mouth daily. 90 tablet 3   aspirin  81 MG tablet Take 81 mg by mouth daily.      Cholecalciferol (VITAMIN D3) LIQD 4,000 Units by Does not apply route daily. Take 4 drops daily     Coenzyme Q10 (CO Q-10) 100 MG CAPS Take 1 capsule by mouth daily.  0   Cyanocobalamin (VITAMIN B12 PO) Take 2,500 mcg by mouth daily. Takes 1/2 dropper     ezetimibe  (ZETIA ) 10 MG tablet TAKE ONE TABLET (10 MG TOTAL) BY MOUTH DAILY. 90 tablet 3   nitroGLYCERIN  (NITROSTAT ) 0.4 MG SL tablet Place 1 tablet (0.4 mg total) under the tongue every 5 (five) minutes as needed for chest pain. 25 tablet 3   levothyroxine  (SYNTHROID )  125 MCG tablet Take 1 tablet (125 mcg total) by mouth daily. 90 tablet 4   rosuvastatin  (CRESTOR ) 40 MG tablet TAKE ONE TABLET BY MOUTH ONCE A DAY AT SIX IN THE EVENING 90 tablet 3   No facility-administered medications prior to visit.     Per HPI unless specifically indicated in ROS section below Review of Systems  Constitutional:  Negative for activity change, appetite change, chills, fatigue, fever and unexpected weight change.  HENT:  Negative for hearing loss.   Eyes:  Negative for visual disturbance.  Respiratory:  Negative for cough, chest tightness, shortness of breath and wheezing.   Cardiovascular:  Negative for chest pain, palpitations and leg swelling.  Gastrointestinal:  Negative for abdominal distention, abdominal pain, blood  in stool, constipation, diarrhea, nausea and vomiting.  Genitourinary:  Negative for difficulty urinating and hematuria.  Musculoskeletal:  Negative for arthralgias, myalgias and neck pain.  Skin:  Negative for rash.  Neurological:  Negative for dizziness, seizures, syncope and headaches.  Hematological:  Negative for adenopathy. Does not bruise/bleed easily.  Psychiatric/Behavioral:  Negative for dysphoric mood. The patient is not nervous/anxious.     Objective:  BP 122/80   Pulse 76   Temp 98.6 F (37 C) (Oral)   Ht 6' 2.5 (1.892 m)   Wt 216 lb (98 kg)   SpO2 98%   BMI 27.36 kg/m   Wt Readings from Last 3 Encounters:  01/09/24 216 lb (98 kg)  01/08/24 214 lb (97.1 kg)  05/29/23 218 lb (98.9 kg)      Physical Exam Vitals and nursing note reviewed.  Constitutional:      General: He is not in acute distress.    Appearance: Normal appearance. He is well-developed. He is not ill-appearing.  HENT:     Head: Normocephalic and atraumatic.     Right Ear: Hearing, tympanic membrane, ear canal and external ear normal.     Left Ear: Hearing, tympanic membrane, ear canal and external ear normal.  Eyes:     General: No scleral icterus.    Extraocular Movements: Extraocular movements intact.     Conjunctiva/sclera: Conjunctivae normal.     Pupils: Pupils are equal, round, and reactive to light.  Neck:     Thyroid : No thyroid  mass or thyromegaly.  Cardiovascular:     Rate and Rhythm: Normal rate and regular rhythm.     Pulses: Normal pulses.          Radial pulses are 2+ on the right side and 2+ on the left side.     Heart sounds: Normal heart sounds. No murmur heard. Pulmonary:     Effort: Pulmonary effort is normal. No respiratory distress.     Breath sounds: Normal breath sounds. No wheezing, rhonchi or rales.  Abdominal:     General: Bowel sounds are normal. There is no distension.     Palpations: Abdomen is soft. There is no mass.     Tenderness: There is no abdominal  tenderness. There is no guarding or rebound.     Hernia: No hernia is present.  Musculoskeletal:        General: Normal range of motion.     Cervical back: Normal range of motion and neck supple.     Right lower leg: No edema.     Left lower leg: No edema.  Lymphadenopathy:     Cervical: No cervical adenopathy.  Skin:    General: Skin is warm and dry.     Findings: No rash.  Neurological:     General: No focal deficit present.     Mental Status: He is alert and oriented to person, place, and time.     Comments:  Recall 3/3 Calculation 5/5 DLROW  Psychiatric:        Mood and Affect: Mood normal.        Behavior: Behavior normal.        Thought Content: Thought content normal.        Judgment: Judgment normal.       Results for orders placed or performed during the hospital encounter of 05/29/23  Surgical pathology   Collection Time: 05/29/23 12:00 AM  Result Value Ref Range   SURGICAL PATHOLOGY      SURGICAL PATHOLOGY [facility] [address], [address] [place], [place] [address] Telephone [phone] or [phone] Fax [phone]  REPORT OF SURGICAL PATHOLOGY   Accession #: [id number] Patient Name: ADRIANE, GUGLIELMO Visit # : [id number]  MRN: [id number] Physician: Jinny Carmine DOB/Age [birth year] (Age: 10) Gender: M Collected Date: 05/29/2023 Received Date: 05/29/2023  FINAL DIAGNOSIS       1. Ascending  Colon Polyp, cold snare x2 :       - TUBULAR ADENOMA (2).      - NEGATIVE FOR HIGH-GRADE DYSPLASIA AND MALIGNANCY.       2. Sigmoid  Colon Polyp, cold snare :       - HYPERPLASTIC POLYP.      - NEGATIVE FOR DYSPLASIA AND MALIGNANCY.       ELECTRONIC SIGNATURE : Rubinas Md, Rexene , Sports Administrator, Electronic Signature  MICROSCOPIC DESCRIPTION  CASE COMMENTS STAINS USED IN DIAGNOSIS: H&E H&E    CLINICAL HISTORY  SPECIMEN(S) OBTAINED 1. Ascending  Colon Polyp, Cold Snare X2 2. Sigmoid  Colon Polyp, Cold Snare  SPECIME N  COMMENTS: SPECIMEN CLINICAL INFORMATION: 1. Screening for colon cancer.Colon polyps    Gross Description 1. Received in formalin is a 1.2 x 1.2 x 0.2 cm aggregate of multiple tan polypoid tissue fragments submitted entirely in block 1A.. 2. Received in formalin is a 1.5 x 1.0 x 0.2 cm aggregate of two tan polypoid tissue fragments submitted entirely in block 2A.(SSW:kh 05/29/23)        Report signed out from the following location(s) [place] [address] [place], [place] [address] CLIA #: [id number]  [facility] [address] [place], [place] [address] CLIA #: [id number]    Lab Results  Component Value Date   TSH 3.67 12/31/2022   Assessment & Plan:   Problem List Items Addressed This Visit     Healthcare maintenance (Chronic)   Preventative protocols reviewed and updated unless pt declined. Discussed healthy diet and lifestyle.       Relevant Orders   Vitamin B12   VITAMIN D  25 Hydroxy (Vit-D Deficiency, Fractures)   Medicare annual wellness visit, initial - Primary (Chronic)   I have personally reviewed the Medicare Annual Wellness questionnaire and have noted 1. The patient's medical and social history 2. Their use of alcohol, tobacco or illicit drugs 3. Their current medications and supplements 4. The patient's functional ability including ADL's, fall risks, home safety risks and hearing or visual impairment. Cognitive function has been assessed and addressed as indicated.  5. Diet and physical activity 6. Evidence for depression or mood disorders The patients weight, height, BMI have been recorded in the chart. I have made referrals, counseling and provided education to the patient based on  review of the above and I have provided the pt with a written personalized care plan for preventive services. Provider list updated.. See scanned questionairre as needed for further documentation. Reviewed preventative protocols  and updated unless pt declined.       Advanced directives, counseling/discussion (Chronic)   Advanced directive discussion - does not have living will. Oldest daughter Harlene is HCPOA. Full code but does not want prolonged life support if terminal condition.       Hyperlipidemia   Chronic, update FLP on statin and zetia .  He is following carnivore diet.  The ASCVD Risk score (Arnett DK, et al., 2019) failed to calculate for the following reasons:   Risk score cannot be calculated because patient has a medical history suggesting prior/existing ASCVD       Relevant Medications   rosuvastatin  (CRESTOR ) 40 MG tablet   Other Relevant Orders   Lipid panel   Comprehensive metabolic panel with GFR   Hypothyroidism   Chronic, stable - update TSH on levothyroxine  125mcg daily      Relevant Medications   levothyroxine  (SYNTHROID ) 125 MCG tablet   Other Relevant Orders   TSH   CAD (coronary artery disease)   Appreciate cardiology care - continue statin, aspirin , zetia .      Relevant Medications   rosuvastatin  (CRESTOR ) 40 MG tablet   Hypertension   Chronic mild only on low dose amlodipine .       Relevant Medications   rosuvastatin  (CRESTOR ) 40 MG tablet   Other Relevant Orders   Microalbumin / creatinine urine ratio   Other Visit Diagnoses       Special screening for malignant neoplasm of prostate       Relevant Orders   PSA        Meds ordered this encounter  Medications   levothyroxine  (SYNTHROID ) 125 MCG tablet    Sig: Take 1 tablet (125 mcg total) by mouth daily.    Dispense:  90 tablet    Refill:  3   rosuvastatin  (CRESTOR ) 40 MG tablet    Sig: Take 1 tablet (40 mg total) by mouth at bedtime.    Dispense:  90 tablet    Refill:  3    Orders Placed This Encounter  Procedures   Lipid panel   Comprehensive metabolic panel with GFR   TSH   PSA   Microalbumin / creatinine urine ratio   Vitamin B12   VITAMIN D  25 Hydroxy (Vit-D Deficiency, Fractures)     Patient Instructions  Fasting labs today  Consider flu, shingles, pneumonia shots.  Advanced directive packet provided today.  Good to see you today Return as needed or in 1 year for next physical/wellness visit.   Follow up plan: Return in about 1 year (around 01/08/2025) for annual exam, prior fasting for blood work, medicare wellness visit.  Anton Blas, MD

## 2024-01-09 NOTE — Assessment & Plan Note (Signed)
 Preventative protocols reviewed and updated unless pt declined. Discussed healthy diet and lifestyle.

## 2024-01-09 NOTE — Patient Instructions (Addendum)
 Fasting labs today  Consider flu, shingles, pneumonia shots.  Advanced directive packet provided today.  Good to see you today Return as needed or in 1 year for next physical/wellness visit.

## 2024-01-09 NOTE — Assessment & Plan Note (Signed)
 Chronic, stable - update TSH on levothyroxine  125mcg daily

## 2024-01-09 NOTE — Assessment & Plan Note (Signed)

## 2024-01-09 NOTE — Assessment & Plan Note (Signed)
 Chronic mild only on low dose amlodipine .

## 2024-01-09 NOTE — Assessment & Plan Note (Signed)
 Appreciate cardiology care - continue statin, aspirin , zetia .

## 2024-01-10 ENCOUNTER — Ambulatory Visit: Payer: Self-pay | Admitting: Family Medicine
